# Patient Record
Sex: Female | Born: 1960 | Race: White | Hispanic: No | Marital: Married | Smoking: Former smoker
Health system: Southern US, Community
[De-identification: ages and names within clinical notes are randomized; demographics above are authoritative.]

## PROBLEM LIST (undated history)

## (undated) DIAGNOSIS — Z8041 Family history of malignant neoplasm of ovary: Secondary | ICD-10-CM

## (undated) DIAGNOSIS — I10 Essential (primary) hypertension: Secondary | ICD-10-CM

## (undated) DIAGNOSIS — Z8719 Personal history of other diseases of the digestive system: Secondary | ICD-10-CM

## (undated) DIAGNOSIS — K219 Gastro-esophageal reflux disease without esophagitis: Secondary | ICD-10-CM

## (undated) DIAGNOSIS — K635 Polyp of colon: Secondary | ICD-10-CM

## (undated) DIAGNOSIS — T7840XA Allergy, unspecified, initial encounter: Secondary | ICD-10-CM

## (undated) DIAGNOSIS — C801 Malignant (primary) neoplasm, unspecified: Secondary | ICD-10-CM

## (undated) DIAGNOSIS — Z803 Family history of malignant neoplasm of breast: Secondary | ICD-10-CM

## (undated) DIAGNOSIS — E785 Hyperlipidemia, unspecified: Secondary | ICD-10-CM

## (undated) DIAGNOSIS — K317 Polyp of stomach and duodenum: Secondary | ICD-10-CM

## (undated) DIAGNOSIS — J45909 Unspecified asthma, uncomplicated: Secondary | ICD-10-CM

## (undated) DIAGNOSIS — E119 Type 2 diabetes mellitus without complications: Secondary | ICD-10-CM

## (undated) DIAGNOSIS — Z800 Family history of malignant neoplasm of digestive organs: Secondary | ICD-10-CM

## (undated) DIAGNOSIS — D126 Benign neoplasm of colon, unspecified: Secondary | ICD-10-CM

## (undated) HISTORY — DX: Malignant (primary) neoplasm, unspecified: C80.1

## (undated) HISTORY — DX: Type 2 diabetes mellitus without complications: E11.9

## (undated) HISTORY — DX: Family history of malignant neoplasm of breast: Z80.3

## (undated) HISTORY — PX: ABDOMINAL HYSTERECTOMY: SHX81

## (undated) HISTORY — DX: Benign neoplasm of colon, unspecified: D12.6

## (undated) HISTORY — PX: TUBAL LIGATION: SHX77

## (undated) HISTORY — PX: HERNIA REPAIR: SHX51

## (undated) HISTORY — DX: Allergy, unspecified, initial encounter: T78.40XA

## (undated) HISTORY — DX: Unspecified asthma, uncomplicated: J45.909

## (undated) HISTORY — DX: Polyp of stomach and duodenum: K31.7

## (undated) HISTORY — PX: LITHOTRIPSY: SUR834

## (undated) HISTORY — DX: Family history of malignant neoplasm of ovary: Z80.41

## (undated) HISTORY — PX: MELANOMA EXCISION: SHX5266

## (undated) HISTORY — PX: LAPAROSCOPIC GASTRIC SLEEVE RESECTION: SHX5895

## (undated) HISTORY — PX: TONSILLECTOMY: SUR1361

## (undated) HISTORY — DX: Gastro-esophageal reflux disease without esophagitis: K21.9

## (undated) HISTORY — PX: COLONOSCOPY: SHX174

## (undated) HISTORY — PX: COSMETIC SURGERY: SHX468

## (undated) HISTORY — DX: Polyp of colon: K63.5

## (undated) HISTORY — DX: Family history of malignant neoplasm of digestive organs: Z80.0

## (undated) HISTORY — DX: Hyperlipidemia, unspecified: E78.5

---

## 1998-01-29 ENCOUNTER — Ambulatory Visit (HOSPITAL_COMMUNITY): Admission: RE | Admit: 1998-01-29 | Discharge: 1998-01-29 | Payer: Self-pay | Admitting: Family Medicine

## 1998-01-29 ENCOUNTER — Encounter: Payer: Self-pay | Admitting: Family Medicine

## 1998-10-12 ENCOUNTER — Emergency Department (HOSPITAL_COMMUNITY): Admission: EM | Admit: 1998-10-12 | Discharge: 1998-10-12 | Payer: Self-pay | Admitting: Internal Medicine

## 2000-03-23 ENCOUNTER — Ambulatory Visit (HOSPITAL_COMMUNITY): Admission: RE | Admit: 2000-03-23 | Discharge: 2000-03-23 | Payer: Self-pay | Admitting: Family Medicine

## 2000-03-23 ENCOUNTER — Encounter: Payer: Self-pay | Admitting: Family Medicine

## 2001-05-10 ENCOUNTER — Other Ambulatory Visit: Admission: RE | Admit: 2001-05-10 | Discharge: 2001-05-10 | Payer: Self-pay | Admitting: Obstetrics and Gynecology

## 2001-05-21 ENCOUNTER — Ambulatory Visit (HOSPITAL_BASED_OUTPATIENT_CLINIC_OR_DEPARTMENT_OTHER): Admission: RE | Admit: 2001-05-21 | Discharge: 2001-05-21 | Payer: Self-pay | Admitting: General Surgery

## 2002-04-18 ENCOUNTER — Encounter: Payer: Self-pay | Admitting: Family Medicine

## 2002-04-18 ENCOUNTER — Ambulatory Visit (HOSPITAL_COMMUNITY): Admission: RE | Admit: 2002-04-18 | Discharge: 2002-04-18 | Payer: Self-pay | Admitting: Family Medicine

## 2003-04-28 ENCOUNTER — Emergency Department (HOSPITAL_COMMUNITY): Admission: EM | Admit: 2003-04-28 | Discharge: 2003-04-28 | Payer: Self-pay | Admitting: *Deleted

## 2003-06-04 ENCOUNTER — Other Ambulatory Visit: Admission: RE | Admit: 2003-06-04 | Discharge: 2003-06-04 | Payer: Self-pay | Admitting: Obstetrics and Gynecology

## 2003-07-02 ENCOUNTER — Observation Stay (HOSPITAL_COMMUNITY): Admission: RE | Admit: 2003-07-02 | Discharge: 2003-07-03 | Payer: Self-pay | Admitting: Obstetrics and Gynecology

## 2003-07-02 ENCOUNTER — Encounter (INDEPENDENT_AMBULATORY_CARE_PROVIDER_SITE_OTHER): Payer: Self-pay | Admitting: *Deleted

## 2004-08-16 ENCOUNTER — Other Ambulatory Visit: Admission: RE | Admit: 2004-08-16 | Discharge: 2004-08-16 | Payer: Self-pay | Admitting: Obstetrics and Gynecology

## 2007-12-28 ENCOUNTER — Emergency Department (HOSPITAL_COMMUNITY): Admission: EM | Admit: 2007-12-28 | Discharge: 2007-12-28 | Payer: Self-pay | Admitting: Emergency Medicine

## 2009-07-26 ENCOUNTER — Ambulatory Visit: Payer: Self-pay | Admitting: Radiology

## 2009-07-26 ENCOUNTER — Emergency Department (HOSPITAL_BASED_OUTPATIENT_CLINIC_OR_DEPARTMENT_OTHER): Admission: EM | Admit: 2009-07-26 | Discharge: 2009-07-26 | Payer: Self-pay | Admitting: Emergency Medicine

## 2010-07-02 NOTE — H&P (Signed)
NAME:  Megan Petty, RINN NO.:  [id number]   MEDICAL RECORD NO.:  [id number]                   PATIENT TYPE:  OBV   LOCATION:  9399                                 [facility]ILITY:  [facility]   PHYSICIAN:  Juluis Mire, M.D.                DATE OF BIRTH:  1961-02-07   DATE OF ADMISSION:  07/02/2003  DATE OF DISCHARGE:                                HISTORY & PHYSICAL   CHIEF COMPLAINT:  The patient is a 50 year old gravida 2 para 2 married  white female who presents for laparoscopic-assisted vaginal hysterectomy.   PRESENT ADMISSION:  The patient has a longstanding history of abnormal  uterine bleeding and anovulatory cycling.  We have been cycling her with  Prometrium.  Despite this she has extremely heavy flows that is becoming  limiting from the patient's standpoint.  She had undergone a previous saline  infusion ultrasound and endometrial sampling in May 2003 with the finding of  uterine enlargement possibly consistent with adenomyosis.  Biopsy of the  endometrium revealed proliferative endometrium.  There was no evidence of  hyperplasia.  She does smoke; therefore, birth control pills are relatively  contraindicated.  Despite the Prometrium, again cycles have been heavy and  disruptive.  Now she presents for laparoscopic-assisted vaginal hysterectomy  for management.  She is markedly obese and this has raised some concern  about surgical management.   ALLERGIES:  No known drug allergies.   MEDICATIONS:  Prometrium.   PAST MEDICAL HISTORY:  Usual childhood diseases, no significant sequelae.   PAST SURGICAL HISTORY:  Two prior cesarean sections with bilateral tubal  ligation done with the last one.   FAMILY HISTORY:  Noncontributory.   SOCIAL HISTORY:  Does reveal a history of tobacco use, occasional alcohol  use.   REVIEW OF SYSTEMS:  Noncontributory.   PHYSICAL EXAMINATION:  VITAL SIGNS:  The patient is afebrile with stable  vital signs.  HEENT:   The patient is normocephalic.  Pupils equal, round, and reactive to  light and accommodation.  Extraocular movements were intact.  Sclerae and  conjunctivae were clear, oropharynx clear.  NECK:  Without thyromegaly.  BREASTS:  No discrete masses.  LUNGS:  Clear.  CARDIOVASCULAR:  Regular rate and rhythm, no murmurs or gallops.  ABDOMEN:  Benign.  No mass, organomegaly, or tenderness.  Well-healed low  transverse incision.  Exam somewhat limited by obesity.  PELVIC:  Normal external genitalia, vaginal mucosa is clear.  Cervix  unremarkable.  Uterus upper limits of normal size.  Adnexa unremarkable.  EXTREMITIES:  Trace edema.  NEUROLOGIC:  Grossly within normal limits.   IMPRESSION:  Abnormal uterine bleeding, possible uterine adenomyosis.   PLAN:  The patient to undergo laparoscopic-assisted vaginal hysterectomy.  The risks of surgery have been discussed.  Specific risks in relation to  obesity have been explained, the possible need for abdominal procedure  discussed.  Other risks include the risks  of anesthetics; the risk of  infection; the risk of hemorrhage that could require transfusion with the  risk of AIDS or hepatitis; the risk of injury to adjacent organs including  bladder, bowel, or ureters that could require further exploratory surgery;  the risk of deep venous thrombosis and pulmonary embolus.                                               Juluis Mire, M.D.    JSM/MEDQ  D:  07/02/2003  T:  07/02/2003  Job:  [id number]

## 2010-07-02 NOTE — Discharge Summary (Signed)
NAME:  Megan Petty, BROTHERTON                         ACCOUNT NO.:  [id number]   MEDICAL RECORD NO.:  [id number]                   PATIENT TYPE:  OBV   LOCATION:  9311                                 [facility]   PHYSICIAN:  Juluis Mire, M.D.                DATE OF BIRTH:  Aug 11, 1960   DATE OF ADMISSION:  07/02/2003  DATE OF DISCHARGE:                                 DISCHARGE SUMMARY   ADMITTING DIAGNOSIS:  Uterine adenomyosis.   DISCHARGE DIAGNOSIS:  Uterine adenomyosis, pathology pending.   OPERATIVE PROCEDURE:  Laparoscopic-assisted vaginal hysterectomy.   For complete history and physical please see dictated note.   COURSE IN THE HOSPITAL:  The patient underwent laparoscopic-assisted vaginal  hysterectomy.  The ovaries were left in place.  Besides pelvic adhesions  there was no other evidence of pelvic pathology.  Postoperatively did well.  Postoperative hemoglobin 9.7.  Discharged home on postoperative day #1.  At  that time was tolerating a diet.  She was ambulating without difficulty.  She was voiding without difficulty.  Abdominal exam revealed incisions  intact, bowel sounds were active.  She had no active vaginal bleeding.   COMPLICATIONS:  None encountered during her stay in the hospital.  The  patient was discharged home in stable condition.   DISPOSITION:  The patient is to avoid heavy lifting, vaginal entrance, or  driving of a car.  She will watch for signs of infection, nausea, vomiting,  increasing pain, or active vaginal bleeding.  She was discharged home on  Tylox as needed for pain.  Reevaluation in the office in 1 week.                                               Juluis Mire, M.D.    JSM/MEDQ  D:  07/03/2003  T:  07/03/2003  Job:  [id number]

## 2010-07-02 NOTE — Op Note (Signed)
NAME:  Megan Petty, Megan Petty                         ACCOUNT NO.:  [id number]   MEDICAL RECORD NO.:  [id number]                   PATIENT TYPE:  OBV   LOCATION:  9311                                 [facility]   PHYSICIAN:  Juluis Mire, M.D.                DATE OF BIRTH:  1960-09-13   DATE OF PROCEDURE:  07/02/2003  DATE OF DISCHARGE:                                 OPERATIVE REPORT   PREOPERATIVE DIAGNOSES:  Adenomyosis resulting abnormal bleeding.   POSTOPERATIVE DIAGNOSES:  Adenomyosis resulting abnormal bleeding.   OPERATION:  Laparoscopically assisted vaginal hysterectomy.   SURGEON:  Juluis Mire, M.D.   ASSISTANT:  Dineen Kid. Rana Snare, M.D.   ANESTHESIA:  General endotracheal.   ESTIMATED BLOOD LOSS:  400 mL.   PACKS AND DRAINS:  None.   INTRAOPERATIVE BLOOD REPLACED:  None.   COMPLICATIONS:  None.   INDICATIONS FOR PROCEDURE:  Dictated in history and physical.   DESCRIPTION OF PROCEDURE:  The patient was taken to the OR and placed in  supine position. After a satisfactory level of general endotracheal  anesthesia was obtained, the patient was placed in the dorsal supine  position using the Allen stirrups. The abdomen, perineum and vagina were  prepped out with Betadine.  The bladder was __________ catheterization. A  Hulka tenaculum was put in place and secured, the patient draped in a  sterile field, a subumbilical incision made with a knife and extended to the  subcutaneous tissue. The fascia was entered sharply and incision __________  extended laterally.  The peritoneum was entered. There were omental  adhesions noted. We inserted the open laparoscopic trocar and inflated the  abdomen. The laparoscope was introduced, we were able to move to the right  side of the omental adhesions, no bowel was encountered. The omental  adhesions did extend to the left side of the lower pelvic area. A 5 mm  trocar was placed in the suprapubic area under direct visualization.  The  uterus was elevated. There were some dense adhesions between the bladder  flap and the left lower quadrant of the pelvic area. The tubes and ovaries  were otherwise unremarkable.  Using the tripolar, the right uteroovarian  pedicles was cauterized and incised, the right tube and mesosalpinx was  cauterized and incised. The right round ligament was cauterized and incised.  We did extend this into the broad ligament. Next we went to the left side,  the left uteroovarian pedicle was cauterized and incised, the left tube and  mesosalpinx were cauterized and incised and the left round ligament was  cauterized. We then took down the bladder flap using the scissors and the  tripolar. After this we had good hemostasis and freeing up of the uterus.  The abdomen was deflated with carbon dioxide, laparoscope was removed.   The Hulka tenaculum was then removed, a weighted speculum was placed in the  vaginal vault.  The cervix was grasped with a Jacob's tenaculum. The cul-de-  sac was entered sharply. Both uterosacral ligaments were clamped, cut and  suture ligated with #0 Vicryl. The reflection of vaginal mucosa anteriorly  was incised and the bladder was dissected superiorly.  Paracervical tissue  was then clamped, cut and suture ligated with #0 Vicryl.  The perimetrium  was then serially separated from the sides of the uterus using clamp, cut  and tie technique with suture ligatures of #0 Vicryl.  The vesicouterine  space had been entered, retractors put in place and bladder was retracted  superiorly. At this point in time, the bladder was flipped, remaining  pedicles were clamped and cut and uterus passed off the operative field.  Pedicles secured with free tie of #0 Vicryl. Areas of bleeding on the  vaginal cuff were brought under control with figure-of-eights of #0 Vicryl.  The vaginal mucosa was then reapproximated in the midline with figure-of-  eights of #0 Vicryl.  A sponge on a sponge  stick was then placed in the  vaginal vault and a Foley was placed to straight drain retrieval of an  adequate amount of clear urine.   The abdomen was reinsufflated with carbon dioxide.  Due to the bowel  descending in the pelvis, a third 5 mm trocar was put in place in the right  lower quadrant. The bowel was elevated and some areas of oozing were noted  at the vaginal cuff and brought under control with the tripolar. Both  ovarian vasculatures were hemostatically intact. At this point in time, we  thoroughly irrigated the pelvis and had no further active bleeding. The  abdomen was deflated with carbon dioxide, all trocars removed, the  subumbilical fascia was closed with two figure-of-eights of #0 Vicryl, skin  with interrupted subcuticular's of 4-0 Vicryl. The suprapubic incision was  closed with Dermabond. The sponge on a sponge stick was removed from the  vaginal vault. The patient was taken out of the dorsal supine position, once  alert and extubated transferred to the recovery room in good condition.  Sponge, instrument and needle counts reported as correct by the circulating  nurse x2.  Foley catheter remained clear at the time of closure.                                               Juluis Mire, M.D.    JSM/MEDQ  D:  07/02/2003  T:  07/03/2003  Job:  [id number]

## 2010-11-16 LAB — POCT I-STAT, CHEM 8
BUN: 17
Creatinine, Ser: 0.9
Glucose, Bld: 100 — ABNORMAL HIGH
Hemoglobin: 16 — ABNORMAL HIGH

## 2010-11-16 LAB — URINALYSIS, ROUTINE W REFLEX MICROSCOPIC
Bilirubin Urine: NEGATIVE
Glucose, UA: NEGATIVE
Ketones, ur: NEGATIVE
Nitrite: NEGATIVE
Specific Gravity, Urine: 1.019
pH: 7

## 2010-11-16 LAB — POCT CARDIAC MARKERS: CKMB, poc: 1.4

## 2010-11-16 LAB — URINE MICROSCOPIC-ADD ON

## 2011-01-19 DIAGNOSIS — J45909 Unspecified asthma, uncomplicated: Secondary | ICD-10-CM | POA: Insufficient documentation

## 2011-02-17 DIAGNOSIS — Z903 Acquired absence of stomach [part of]: Secondary | ICD-10-CM | POA: Insufficient documentation

## 2011-05-01 ENCOUNTER — Encounter (HOSPITAL_COMMUNITY): Payer: Self-pay | Admitting: Emergency Medicine

## 2011-05-01 ENCOUNTER — Emergency Department (HOSPITAL_COMMUNITY)
Admission: EM | Admit: 2011-05-01 | Discharge: 2011-05-01 | Disposition: A | Discharge status: Home Health | Payer: Managed Care, Other (non HMO) | Attending: Emergency Medicine | Admitting: Emergency Medicine

## 2011-05-01 DIAGNOSIS — N949 Unspecified condition associated with female genital organs and menstrual cycle: Secondary | ICD-10-CM | POA: Insufficient documentation

## 2011-05-01 DIAGNOSIS — N760 Acute vaginitis: Secondary | ICD-10-CM | POA: Insufficient documentation

## 2011-05-01 DIAGNOSIS — I10 Essential (primary) hypertension: Secondary | ICD-10-CM | POA: Insufficient documentation

## 2011-05-01 DIAGNOSIS — R102 Pelvic and perineal pain: Secondary | ICD-10-CM

## 2011-05-01 DIAGNOSIS — A499 Bacterial infection, unspecified: Secondary | ICD-10-CM | POA: Insufficient documentation

## 2011-05-01 DIAGNOSIS — B9689 Other specified bacterial agents as the cause of diseases classified elsewhere: Secondary | ICD-10-CM | POA: Insufficient documentation

## 2011-05-01 DIAGNOSIS — R3916 Straining to void: Secondary | ICD-10-CM | POA: Insufficient documentation

## 2011-05-01 HISTORY — DX: Essential (primary) hypertension: I10

## 2011-05-01 LAB — URINE MICROSCOPIC-ADD ON

## 2011-05-01 LAB — WET PREP, GENITAL

## 2011-05-01 LAB — URINALYSIS, ROUTINE W REFLEX MICROSCOPIC
Glucose, UA: NEGATIVE mg/dL
Specific Gravity, Urine: 1.024 (ref 1.005–1.030)
Urobilinogen, UA: 1 mg/dL (ref 0.0–1.0)

## 2011-05-01 MED ORDER — METRONIDAZOLE 500 MG PO TABS: 500.0000 mg | ORAL_TABLET | Freq: Two times a day (BID) | ORAL | Status: AC

## 2011-05-01 MED ORDER — OXYCODONE-ACETAMINOPHEN 5-325 MG PO TABS: 1.0000 | ORAL_TABLET | Freq: Four times a day (QID) | ORAL | Status: AC | PRN

## 2011-05-01 NOTE — ED Provider Notes (Signed)
History     CSN: [id number]  Arrival date & time 05/01/11  0505   First MD Initiated Contact with Patient 05/01/11 0601      Chief Complaint  Patient presents with  . Pelvic Pain    and pressure    (Consider location/radiation/quality/duration/timing/severity/associated sxs/prior treatment) HPI Comments: Patient presents with several week history of pelvic pain. Pain is intermittent and is described as a pressure and like she "has to go to the bathroom". Patient also states some difficulty initiating urine stream at times. Pain is worse in the evening. It is made worse with lying flat. It is improved with "sitting on the toilet". Patient states she takes hydrocodone that she had left over from a recent surgery which helped the pain up until last night. Last night pain was worse and was persistent. The patient cannot sleep. This is why she came to the emergency department. Patient has an upcoming appointment with a urologist next month. She was seen previously by her primary care physician and had normal urine tests. Patient had gastric sleeve and hernia surgery performed several months ago at [facility].  The history is provided by the patient.    Past Medical History  Diagnosis Date  . Hypertension     Past Surgical History  Procedure Date  . Abdominal surgery   . Hernia repair   . Partial hysterectomy     History reviewed. No pertinent family history.  History  Substance Use Topics  . Smoking status: Never Smoker   . Smokeless tobacco: Not on file  . Alcohol Use: No    OB History    Grav Para Term Preterm Abortions TAB SAB Ect Mult Living                  Review of Systems  Constitutional: Negative for fever.  HENT: Negative for sore throat and rhinorrhea.   Eyes: Negative for redness.  Respiratory: Negative for cough.   Cardiovascular: Negative for chest pain.  Gastrointestinal: Negative for nausea, vomiting, abdominal pain, diarrhea and blood in  stool.  Genitourinary: Positive for urgency, difficulty urinating, vaginal pain and pelvic pain. Negative for dysuria, frequency, hematuria, vaginal bleeding and vaginal discharge.  Musculoskeletal: Negative for myalgias.  Skin: Negative for rash.  Neurological: Negative for headaches.    Allergies  Review of patient's allergies indicates no known allergies.  Home Medications   Current Outpatient Rx  Name Route Sig Dispense Refill  . HYDROCODONE-ACETAMINOPHEN 7.5-500 MG/15ML PO SOLN Oral Take 10 mLs by mouth every 4 (four) hours as needed. For pain    . LISINOPRIL 20 MG PO TABS Oral Take 20 mg by mouth daily.    Marland Kitchen OMEPRAZOLE 40 MG PO CPDR Oral Take 40 mg by mouth daily.    Marland Kitchen URSODIOL 300 MG PO CAPS Oral Take 300 mg by mouth 2 (two) times daily.      BP 120/78  Pulse 91  Temp(Src) 97.6 F (36.4 C) (Oral)  Resp 20  SpO2 100%  Physical Exam  Nursing note and vitals reviewed. Constitutional: She is oriented to person, place, and time. She appears well-developed and well-nourished.  HENT:  Head: Normocephalic and atraumatic.  Eyes: Conjunctivae are normal. Right eye exhibits no discharge. Left eye exhibits no discharge.  Neck: Normal range of motion. Neck supple.  Cardiovascular: Normal rate, regular rhythm and normal heart sounds.   No murmur heard. Pulmonary/Chest: Effort normal and breath sounds normal. No respiratory distress.  Abdominal: Soft. There is no tenderness.  Genitourinary: There is no rash on the right labia. There is no rash on the left labia. Right adnexum displays no tenderness. Left adnexum displays no tenderness. No erythema or bleeding around the vagina. No foreign body around the vagina. Vaginal discharge found.       Cervix not present. Thick white vaginal discharge noted in vaginal vault. No obvious vaginal prolapse. No L/R adnexal tenderness. Pressure reproduced with palpation over suprapubic area on bimanual exam.  Neurological: She is alert and oriented  to person, place, and time.  Skin: Skin is warm and dry.  Psychiatric: She has a normal mood and affect.    ED Course  Procedures (including critical care time)  Labs Reviewed  WET PREP, GENITAL - Abnormal; Notable for the following:    Clue Cells Wet Prep HPF POC TOO NUMEROUS TO COUNT (*)    WBC, Wet Prep HPF POC MODERATE (*)    All other components within normal limits  URINALYSIS, ROUTINE W REFLEX MICROSCOPIC - Abnormal; Notable for the following:    APPearance CLOUDY (*)    Hgb urine dipstick MODERATE (*)    Bilirubin Urine SMALL (*)    Ketones, ur TRACE (*)    Leukocytes, UA MODERATE (*)    All other components within normal limits  URINE MICROSCOPIC-ADD ON - Abnormal; Notable for the following:    Squamous Epithelial / LPF MANY (*)    Bacteria, UA FEW (*)    Crystals CA OXALATE CRYSTALS (*)    All other components within normal limits  GC/CHLAMYDIA PROBE AMP, GENITAL  URINE CULTURE   No results found.   1. Pelvic pain   2. Bacterial vaginosis     6:46 AM Patient seen and examined. Work-up initiated.  Vital signs reviewed and are as follows: Filed Vitals:   05/01/11 0506  BP: 120/78  Pulse: 91  Temp: 97.6 F (36.4 C)  Resp: 20   6:46 AM Patient was discussed with Gwyneth Sprout, MD  7:29 AM Pelvic exam performed.   8:05 AM Patient informed of all results, including wet prep. Will treat for BV. Pt is requesting discharge because of an event at her church. Her pain is improved. Urged patient to take antibiotics as prescribed and followup with her PCP and urologist as previously planned.  8:06 AM The patient was urged to return to the Emergency Department immediately with worsening of current symptoms, worsening abdominal pain, persistent vomiting, blood noted in stools, fever, or any other concerns. The patient verbalized understanding.   8:06 AM Patient counseled on use of narcotic pain medications. Counseled not to combine these medications with others  containing tylenol. Urged not to drink alcohol, drive, or perform any other activities that requires focus while taking these medications. The patient verbalizes understanding and agrees with the plan.  MDM  Patient with pelvic pain. Do not suspect abdominal infection given duration of symptoms and presentation. BV noted and will treat. UA is not clean catch, culture sent. UA also shows calcium oxalate crystals however symptoms are atypical for kidney stone. Doubt kidney stone at this time. Patient has appropriate urology followup. No definite vaginal prolapse. Uncertain bladder prolapse. Patient noted to be hypotensive at discharge, however she appears well, not dizzy. This is likely 2/2 resting and pain medication.    Renne Crigler, [place] 05/01/11 [phone]

## 2011-05-01 NOTE — ED Notes (Signed)
Pt presented to the ER with c/o pelvic pain and pressure. States that sx started about 5 weeks ago and pain is on and off, however today pain started around 2100 and not able to be controled. Pt also states that she has Rx pain medication, no relief. Pt reports having surgery in the [facility] abd surgery "beariatric sleve and x2 hernias are repaired, inguinal and umbilical.

## 2011-05-01 NOTE — Discharge Instructions (Signed)
Please read and follow all provided instructions.  Your diagnoses today include:  1. Pelvic pain   2. Bacterial vaginosis     Tests performed today include:  Urine test - showed uncertain infection, culture sent. If this is positive, you will be called and given a prescription for an antibiotic  Wet prep - shows bacterial vaginosis  Vital signs. See below for your results today.   Medications prescribed:   Percocet (oxycodone/acetaminophen) - narcotic pain medication  You have been prescribed narcotic pain medication such as Vicodin or Percocet: DO NOT drive or perform any activities that require you to be awake and alert because this medicine can make you drowsy. BE VERY CAREFUL not to take multiple medicines containing Tylenol (also called acetaminophen). Doing so can lead to an overdose which can damage your liver and cause liver failure and possibly death.   Take any prescribed medications only as directed.  Home care instructions:  Follow any educational materials contained in this packet.  Follow-up instructions: Please follow-up with your primary care provider in the next 3 days for further evaluation of your symptoms. If you do not have a primary care doctor -- see below for referral information.   Return instructions:   Please return to the Emergency Department if you experience worsening symptoms.   See your doctor and urologist as previously planned.   Please return if you have any other emergent concerns.  Additional Information:  Your vital signs today were: BP 86/43  Pulse 65  Temp(Src) 97.5 F (36.4 C) (Oral)  Resp 18  SpO2 98% If your blood pressure (BP) was elevated above 135/85 this visit, please have this repeated by your doctor within one month. -------------- No Primary Care Doctor Call [facility]  [phone] Other agencies that provide inexpensive medical care    [facility]  [phone]    [facility]   [phone]    [facility]  [phone]    [facility]  [phone]    [facility]  [phone]    [facility]  [phone] -------------- RESOURCE GUIDE:  Dental Problems  Patients with Medicaid: [facility] [phone]                                            [phone] [address] Phone:  [phone]                                                   Phone:  [phone]  If unable to pay or uninsured, contact:  [facility] or [facility] to become qualified for the adult dental clinic.  Chronic Pain Problems Contact [facility]  [phone] Patients need to be referred by their primary care doctor.  Insufficient Money for Medicine Contact [employer] Way:  call "211" or [facility] [phone].  Psychological Services [facility] Behavioral Health  [phone] [facility]  [phone] [facility] Mental Health   [phone] (emergency services [phone])  Substance Abuse Resources [facility]  [phone] [facility] [phone]  [facility] [phone] [facility] [phone] Residential & Outpatient Substance Abuse Program  [phone]  Abuse/Neglect [facility] Child Abuse Hotline [phone] [facility] Child Abuse Hotline [phone]) [phone] (After Hours)  Emergency Shelter [facility] [phone]  [facility] [place] [facility] 5208815645 [place] [facility] [phone]  [facility]  [facility] [place]     [employer]                          [facility] Dept. [address]                        [address]      [address]  Blondell Reveal Phone:  [phone]                                   Phone:  [phone]                  Phone:  [phone]  [facility] Phone:  [phone]  [facility] Child Abuse Hotline [phone] [phone] (After Hours)

## 2011-05-02 LAB — URINE CULTURE

## 2011-05-04 NOTE — ED Provider Notes (Signed)
Medical screening examination/treatment/procedure(s) were performed by non-physician practitioner and as supervising physician I was immediately available for consultation/collaboration.   Gwyneth Sprout, MD 05/04/11 1526

## 2012-02-23 LAB — HM PAP SMEAR: HM PAP: NEGATIVE

## 2012-02-24 ENCOUNTER — Encounter: Payer: Self-pay | Admitting: Internal Medicine

## 2012-03-22 ENCOUNTER — Ambulatory Visit (AMBULATORY_SURGERY_CENTER): Payer: Managed Care, Other (non HMO) | Admitting: *Deleted

## 2012-03-22 ENCOUNTER — Encounter: Payer: Self-pay | Admitting: Internal Medicine

## 2012-03-22 VITALS — Ht 68.0 in | Wt 229.2 lb

## 2012-03-22 DIAGNOSIS — Z1211 Encounter for screening for malignant neoplasm of colon: Secondary | ICD-10-CM

## 2012-03-22 MED ORDER — NA SULFATE-K SULFATE-MG SULF 17.5-3.13-1.6 GM/177ML PO SOLN: ORAL | Status: DC

## 2012-03-22 NOTE — Progress Notes (Signed)
Pt has no allergy to eggs or soy products  She has a gastric sleeve and states she is limited in her po intake.  States she isn't able to do the moviprep and cannot do the Suprep split dose as normally instructed.  Spoke with Dr. Rhea Belton and he told me to call the Suprep rep, Jonny Ruiz, for instruction.  Per Jonny Ruiz, pt is to drink 1st of Suprep the a.m. Day before her procedure.  The, drink second dose 10 hours later.  She is to push fluids as much as possible.  She is instructed this way.

## 2012-03-28 ENCOUNTER — Encounter: Payer: Managed Care, Other (non HMO) | Admitting: Internal Medicine

## 2012-04-04 ENCOUNTER — Ambulatory Visit (AMBULATORY_SURGERY_CENTER): Payer: Managed Care, Other (non HMO) | Admitting: Internal Medicine

## 2012-04-04 ENCOUNTER — Encounter: Payer: Self-pay | Admitting: Internal Medicine

## 2012-04-04 VITALS — BP 94/60 | HR 51 | Temp 96.8°F | Resp 23 | Ht 68.0 in | Wt 229.0 lb

## 2012-04-04 DIAGNOSIS — D126 Benign neoplasm of colon, unspecified: Secondary | ICD-10-CM

## 2012-04-04 DIAGNOSIS — Z800 Family history of malignant neoplasm of digestive organs: Secondary | ICD-10-CM

## 2012-04-04 DIAGNOSIS — Z1211 Encounter for screening for malignant neoplasm of colon: Secondary | ICD-10-CM

## 2012-04-04 MED ORDER — SODIUM CHLORIDE 0.9 % IV SOLN: 500.0000 mL | INTRAVENOUS | Status: DC

## 2012-04-04 NOTE — Op Note (Signed)
[facility] [address] [place], [address]   COLONOSCOPY PROCEDURE REPORT  PATIENT: Megan, Petty  MR#: [id number] BIRTHDATE: 1960/04/28 , 51  yrs. old GENDER: Female ENDOSCOPIST: Beverley Fiedler, MD REFERRED BY: Bradd Burner, MD PROCEDURE DATE:  04/04/2012 PROCEDURE:   Colonoscopy with snare polypectomy ASA CLASS:   Class II INDICATIONS:elevated risk screening, Patient's immediate family history of colon cancer (mother age 22), and first colonoscopy. MEDICATIONS: MAC sedation, administered by CRNA and propofol (Diprivan) 300mg  IV  DESCRIPTION OF PROCEDURE:   After the risks benefits and alternatives of the procedure were thoroughly explained, informed consent was obtained.  A digital rectal exam revealed external hemorrhoids.   The LB [id number]  endoscope was introduced through the anus and advanced to the cecum, which was identified by both the appendix and ileocecal valve. No adverse events experienced.   The quality of the prep was Suprep fair  The instrument was then slowly withdrawn as the colon was fully examined.   COLON FINDINGS: Three sessile polyps measuring 3 - 6 mm were found in the ascending colon and transverse colon.   Polyps removed with cold snare and successfully retrieved.  There was mild diverticulosis noted in the descending colon and sigmoid colon with associated muscular hypertrophy. Retroflexed views revealed internal/external hemorrhoids. The time to cecum=2 minutes 26 seconds.  Withdrawal time=15 minutes 42 seconds.  The scope was withdrawn and the procedure completed. COMPLICATIONS: There were no complications.  ENDOSCOPIC IMPRESSION: 1.   Three sessile polyps were found in the ascending colon and transverse colon; removed and sent to pathology 2.   There was mild diverticulosis noted in the descending colon and sigmoid colon 3.   Moderate sized internal and external hemorrhoids  RECOMMENDATIONS: 1.   Await pathology results 2.  Hold aspirin, aspirin products, and anti-inflammatory medication for 1 week. 3.  High fiber diet 4.  Timing of repeat colonoscopy will be determined by pathology findings (3 years if all adenomas, otherwise 5 years). 5.  You will receive a letter within 1-2 weeks with the results of your biopsy as well as final recommendations.  Please call my office if you have not received a letter after 3 weeks.   eSigned:  Beverley Fiedler, MD 04/04/2012 9:01 AM   cc: The Patient; Dr. Janace Litten

## 2012-04-04 NOTE — Patient Instructions (Signed)
YOU HAD AN ENDOSCOPIC PROCEDURE TODAY AT THE [facility]: Refer to the procedure report that was given to you for any specific questions about what was found during the examination.  If the procedure report does not answer your questions, please call your gastroenterologist to clarify.  If you requested that your care partner not be given the details of your procedure findings, then the procedure report has been included in a sealed envelope for you to review at your convenience later.  YOU SHOULD EXPECT: Some feelings of bloating in the abdomen. Passage of more gas than usual.  Walking can help get rid of the air that was put into your GI tract during the procedure and reduce the bloating. If you had a lower endoscopy (such as a colonoscopy or flexible sigmoidoscopy) you may notice spotting of blood in your stool or on the toilet paper. If you underwent a bowel prep for your procedure, then you may not have a normal bowel movement for a few days.  DIET: Your first meal following the procedure should be a light meal and then it is ok to progress to your normal diet.  A half-sandwich or bowl of soup is an example of a good first meal.  Heavy or fried foods are harder to digest and may make you feel nauseous or bloated.  Likewise meals heavy in dairy and vegetables can cause extra gas to form and this can also increase the bloating.  Drink plenty of fluids but you should avoid alcoholic beverages for 24 hours.  ACTIVITY: Your care partner should take you home directly after the procedure.  You should plan to take it easy, moving slowly for the rest of the day.  You can resume normal activity the day after the procedure however you should NOT DRIVE or use heavy machinery for 24 hours (because of the sedation medicines used during the test).    SYMPTOMS TO REPORT IMMEDIATELY: A gastroenterologist can be reached at any hour.  During normal business hours, 8:30 AM to 5:00 PM Monday through Friday,  call [phone].  After hours and on weekends, please call the GI answering service at [phone] who will take a message and have the physician on call contact you.   Following lower endoscopy (colonoscopy or flexible sigmoidoscopy):  Excessive amounts of blood in the stool  Significant tenderness or worsening of abdominal pains  Swelling of the abdomen that is new, acute  Fever of 100F or higher    FOLLOW UP: If any biopsies were taken you will be contacted by phone or by letter within the next 1-3 weeks.  Call your gastroenterologist if you have not heard about the biopsies in 3 weeks.  Our staff will call the home number listed on your records the next business day following your procedure to check on you and address any questions or concerns that you may have at that time regarding the information given to you following your procedure. This is a courtesy call and so if there is no answer at the home number and we have not heard from you through the emergency physician on call, we will assume that you have returned to your regular daily activities without incident.  SIGNATURES/CONFIDENTIALITY: You and/or your care partner have signed paperwork which will be entered into your electronic medical record.  These signatures attest to the fact that that the information above on your After Visit Summary has been reviewed and is understood.  Full responsibility of the confidentiality   of this discharge information lies with you and/or your care-partner.    Information on polyps,diverticulosis,hemorrhoids,& high fiber diet given to you today   HOLD ASPIRIN AND ANTI-INFLAMMATORY PRODUCTS FOR ONE WEEK

## 2012-04-04 NOTE — Progress Notes (Signed)
Lidocaine-40mg IV prior to Propofol InductionPropofol given over incremental dosages 

## 2012-04-04 NOTE — Progress Notes (Signed)
Patient did not experience any of the following events: a burn prior to discharge; a fall within the facility; wrong site/side/patient/procedure/implant event; or a hospital transfer or hospital admission upon discharge from the facility. (G8907) Patient did not have preoperative order for IV antibiotic SSI prophylaxis. (G8918)  

## 2012-04-04 NOTE — Progress Notes (Signed)
Called to room to assist during endoscopic procedure.  Patient ID and intended procedure confirmed with present staff. Received instructions for my participation in the procedure from the performing physician.  

## 2012-04-05 ENCOUNTER — Telehealth: Payer: Self-pay | Admitting: *Deleted

## 2012-04-05 NOTE — Telephone Encounter (Signed)
  Follow up Call-  Call back number 04/04/2012  Post procedure Call Back phone  # [phone]     Patient questions:  Do you have a fever, pain , or abdominal swelling? no Pain Score  0 *  Have you tolerated food without any problems? yes  Have you been able to return to your normal activities? yes  Do you have any questions about your discharge instructions: Diet   no Medications  no Follow up visit  no  Do you have questions or concerns about your Care? no  Actions: * If pain score is 4 or above: No action needed, pain <4.

## 2012-04-09 ENCOUNTER — Encounter: Payer: Self-pay | Admitting: Internal Medicine

## 2012-04-23 ENCOUNTER — Other Ambulatory Visit: Payer: Self-pay | Admitting: Dermatology

## 2012-09-18 ENCOUNTER — Telehealth (INDEPENDENT_AMBULATORY_CARE_PROVIDER_SITE_OTHER): Payer: Self-pay

## 2012-09-18 NOTE — Telephone Encounter (Signed)
Attempted to contact pt to let her know about her appointment tomorrow with Dr. Daphine Deutscher @ 1130am.  Phone does not have answering machine/VM.

## 2012-09-19 ENCOUNTER — Ambulatory Visit (INDEPENDENT_AMBULATORY_CARE_PROVIDER_SITE_OTHER): Payer: Self-pay | Admitting: Surgery

## 2012-11-13 ENCOUNTER — Encounter (INDEPENDENT_AMBULATORY_CARE_PROVIDER_SITE_OTHER): Payer: Self-pay

## 2012-12-17 ENCOUNTER — Encounter (HOSPITAL_COMMUNITY): Payer: Self-pay | Admitting: Pharmacy Technician

## 2012-12-21 ENCOUNTER — Encounter (HOSPITAL_COMMUNITY)
Admission: RE | Admit: 2012-12-21 | Discharge: 2012-12-21 | Disposition: A | Discharge status: Home / Self Care | Payer: Managed Care, Other (non HMO) | Source: Ambulatory Visit | Attending: Specialist | Admitting: Specialist

## 2012-12-21 ENCOUNTER — Encounter (HOSPITAL_COMMUNITY): Payer: Self-pay

## 2012-12-21 ENCOUNTER — Encounter (HOSPITAL_COMMUNITY)
Admission: RE | Admit: 2012-12-21 | Discharge: 2012-12-21 | Disposition: A | Discharge status: Home / Self Care | Payer: Managed Care, Other (non HMO) | Source: Ambulatory Visit | Attending: Anesthesiology | Admitting: Anesthesiology

## 2012-12-21 DIAGNOSIS — Z01812 Encounter for preprocedural laboratory examination: Secondary | ICD-10-CM | POA: Insufficient documentation

## 2012-12-21 DIAGNOSIS — Z01818 Encounter for other preprocedural examination: Secondary | ICD-10-CM | POA: Insufficient documentation

## 2012-12-21 DIAGNOSIS — Z01810 Encounter for preprocedural cardiovascular examination: Secondary | ICD-10-CM | POA: Insufficient documentation

## 2012-12-21 HISTORY — DX: Personal history of other diseases of the digestive system: Z87.19

## 2012-12-21 LAB — CBC
HCT: 43.8 % (ref 36.0–46.0)
Hemoglobin: 15.4 g/dL — ABNORMAL HIGH (ref 12.0–15.0)
MCH: 30.7 pg (ref 26.0–34.0)
MCHC: 35.2 g/dL (ref 30.0–36.0)
MCV: 87.4 fL (ref 78.0–100.0)
Platelets: 198 10*3/uL (ref 150–400)
RBC: 5.01 MIL/uL (ref 3.87–5.11)
RDW: 12.5 % (ref 11.5–15.5)

## 2012-12-21 LAB — BASIC METABOLIC PANEL
BUN: 24 mg/dL — ABNORMAL HIGH (ref 6–23)
CO2: 31 mEq/L (ref 19–32)
Calcium: 9.9 mg/dL (ref 8.4–10.5)
Chloride: 103 mEq/L (ref 96–112)
Creatinine, Ser: 0.88 mg/dL (ref 0.50–1.10)
GFR calc non Af Amer: 75 mL/min — ABNORMAL LOW (ref >90)
Glucose, Bld: 106 mg/dL — ABNORMAL HIGH (ref 70–99)
Sodium: 145 mEq/L (ref 135–145)

## 2012-12-21 NOTE — Pre-Procedure Instructions (Signed)
Megan Petty  12/21/2012   Your procedure is scheduled on: Nov 17th @ 0730  Report to [facility]  2 * 3 at 0530 AM.  Call this number if you have problems the morning of surgery: [phone]   Remember:   Do not eat food or drink liquids after midnight.   Take these medicines the morning of surgery with A SIP OF WATER: NA  Stop taking Aspirin, Aleve, Ibuprofen, BC's, Goody's, Herbal medications, and Fish Oil. Nov 10th Stop taking Naproxen Nov 10th   Do not wear jewelry, make-up or nail polish.  Do not wear lotions, powders, or perfumes. You may wear deodorant.  Do not shave 48 hours prior to surgery.   Do not bring valuables to the hospital.  [facility] is not responsible                  for any belongings or valuables.               Contacts, dentures or bridgework may not be worn into surgery.  Leave suitcase in the car. After surgery it may be brought to your room.  For patients admitted to the hospital, discharge time is determined by your                treatment team.               Patients discharged the day of surgery will not be allowed to drive  home.    Special Instructions: Shower using CHG 2 nights before surgery and the night before surgery.  If you shower the day of surgery use CHG.  Use special wash - you have one bottle of CHG for all showers.  You should use approximately 1/3 of the bottle for each shower.   Please read over the following fact sheets that you were given: Pain Booklet, Coughing and Deep Breathing and Surgical Site Infection Prevention

## 2012-12-21 NOTE — Progress Notes (Signed)
PCP is Elvera Lennox Denies having a cardiologist. Denies having a recent EKG or CXR Denies having a stress test, echo, or card cath.

## 2012-12-25 NOTE — Progress Notes (Signed)
Called Dr. Charlesetta Garibaldi office and spoke to Robards.  Requested orders be placed in EPIC.

## 2012-12-27 NOTE — Progress Notes (Addendum)
Spoke with a voice mail at Dr Charlesetta Garibaldi office to re-request orders be placed in EPIC.

## 2012-12-28 ENCOUNTER — Other Ambulatory Visit: Payer: Self-pay | Admitting: Specialist

## 2012-12-29 ENCOUNTER — Other Ambulatory Visit: Payer: Self-pay | Admitting: Specialist

## 2012-12-29 NOTE — H&P (Signed)
Megan Petty is an 52 y.o. female.   Chief Complaint: Increased panniculus and periumbilical pain HPI: HX of obesity with resultant intertriginous changes with back pain Hx of by pass GI surgery  Past Medical History  Diagnosis Date  . Hypertension   . Allergy   . Asthma     seasonal  . Cancer     melanoma- left leg  . Diabetes mellitus without complication     hx, not any longer  . H/O hiatal hernia     had surgery to fix with last surgery  . GERD (gastroesophageal reflux disease)     Past Surgical History  Procedure Laterality Date  . Abdominal surgery    . Partial hysterectomy    . Gastric bypass sleeve      repaired 2 hernias at that time  . Melanoma excision    . Cesarean section    . Tonsillectomy    . Hernia repair    . Colonoscopy    . Abdominal hysterectomy      Family History  Problem Relation Age of Onset  . Colon cancer Mother   . Esophageal cancer Neg Hx   . Rectal cancer Neg Hx   . Stomach cancer Neg Hx    Social History:  reports that she quit smoking about 3 years ago. She has never used smokeless tobacco. She reports that she does not drink alcohol or use illicit drugs.  Allergies: No Known Allergies  No prescriptions prior to admission    No results found for this or any previous visit (from the past 48 hour(s)). No results found.  Review of Systems  Constitutional: Negative.   HENT: Negative.   Eyes: Negative.   Respiratory: Negative.   Cardiovascular: Negative.   Gastrointestinal: Positive for heartburn.  Genitourinary: Negative.   Musculoskeletal: Negative.   Skin: Positive for rash.  Neurological: Negative.   Endo/Heme/Allergies: Negative.   Psychiatric/Behavioral: Negative.     There were no vitals taken for this visit. Physical Exam   Assessment/Plan Severe panniculus with panniculitis for panniculectomy and repair of ventral hernia a nd liposuction assistance  Kanetra Ho L 12/29/2012, 12:47 PM

## 2012-12-31 ENCOUNTER — Observation Stay (HOSPITAL_COMMUNITY)
Admission: RE | Admit: 2012-12-31 | Discharge: 2013-01-01 | Disposition: A | Discharge status: Home / Self Care | Payer: Managed Care, Other (non HMO) | Source: Ambulatory Visit | Attending: Specialist | Admitting: Specialist

## 2012-12-31 ENCOUNTER — Encounter (HOSPITAL_COMMUNITY)
Admission: RE | Disposition: A | Discharge status: Home / Self Care | Payer: Self-pay | Source: Ambulatory Visit | Attending: Specialist

## 2012-12-31 ENCOUNTER — Encounter (HOSPITAL_COMMUNITY): Payer: Self-pay | Admitting: Certified Registered Nurse Anesthetist

## 2012-12-31 ENCOUNTER — Inpatient Hospital Stay (HOSPITAL_COMMUNITY): Payer: Managed Care, Other (non HMO) | Admitting: Certified Registered Nurse Anesthetist

## 2012-12-31 ENCOUNTER — Encounter (HOSPITAL_COMMUNITY): Payer: Managed Care, Other (non HMO) | Admitting: Certified Registered Nurse Anesthetist

## 2012-12-31 DIAGNOSIS — Z87891 Personal history of nicotine dependence: Secondary | ICD-10-CM | POA: Insufficient documentation

## 2012-12-31 DIAGNOSIS — Z9889 Other specified postprocedural states: Secondary | ICD-10-CM | POA: Insufficient documentation

## 2012-12-31 DIAGNOSIS — R12 Heartburn: Secondary | ICD-10-CM | POA: Insufficient documentation

## 2012-12-31 DIAGNOSIS — Z9884 Bariatric surgery status: Secondary | ICD-10-CM | POA: Insufficient documentation

## 2012-12-31 DIAGNOSIS — K219 Gastro-esophageal reflux disease without esophagitis: Secondary | ICD-10-CM | POA: Insufficient documentation

## 2012-12-31 DIAGNOSIS — E119 Type 2 diabetes mellitus without complications: Secondary | ICD-10-CM | POA: Insufficient documentation

## 2012-12-31 DIAGNOSIS — M793 Panniculitis, unspecified: Principal | ICD-10-CM | POA: Insufficient documentation

## 2012-12-31 DIAGNOSIS — K439 Ventral hernia without obstruction or gangrene: Secondary | ICD-10-CM | POA: Insufficient documentation

## 2012-12-31 DIAGNOSIS — I10 Essential (primary) hypertension: Secondary | ICD-10-CM | POA: Insufficient documentation

## 2012-12-31 DIAGNOSIS — Z90710 Acquired absence of both cervix and uterus: Secondary | ICD-10-CM | POA: Insufficient documentation

## 2012-12-31 HISTORY — PX: ABDOMINOPLASTY/PANNICULECTOMY WITH LIPOSUCTION: SHX5577

## 2012-12-31 LAB — COMPREHENSIVE METABOLIC PANEL
AST: 15 U/L (ref 0–37)
Albumin: 3.2 g/dL — ABNORMAL LOW (ref 3.5–5.2)
Alkaline Phosphatase: 60 U/L (ref 39–117)
BUN: 18 mg/dL (ref 6–23)
CO2: 24 mEq/L (ref 19–32)
Chloride: 105 mEq/L (ref 96–112)
Creatinine, Ser: 0.88 mg/dL (ref 0.50–1.10)
GFR calc non Af Amer: 75 mL/min — ABNORMAL LOW (ref >90)
Glucose, Bld: 102 mg/dL — ABNORMAL HIGH (ref 70–99)
Potassium: 3.6 mEq/L (ref 3.5–5.1)
Sodium: 139 mEq/L (ref 135–145)
Total Bilirubin: 0.5 mg/dL (ref 0.3–1.2)
Total Protein: 6.4 g/dL (ref 6.0–8.3)

## 2012-12-31 LAB — PROTIME-INR
INR: 1.04 (ref 0.00–1.49)
Prothrombin Time: 13.4 seconds (ref 11.6–15.2)

## 2012-12-31 LAB — CBC
HCT: 39.6 % (ref 36.0–46.0)
Hemoglobin: 13.8 g/dL (ref 12.0–15.0)
MCHC: 34.8 g/dL (ref 30.0–36.0)
Platelets: 132 10*3/uL — ABNORMAL LOW (ref 150–400)
RDW: 12.5 % (ref 11.5–15.5)
WBC: 7.1 10*3/uL (ref 4.0–10.5)

## 2012-12-31 SURGERY — PANNICULECTOMY, ABDOMINAL, WITH LIPOSUCTION
Anesthesia: General | Site: Abdomen | Wound class: Clean

## 2012-12-31 MED ORDER — MORPHINE SULFATE 2 MG/ML IJ SOLN
2.0000 mg | INTRAMUSCULAR | Status: DC | PRN
  Administered 2012-12-31 (×2): 2 mg via INTRAVENOUS
  Filled 2012-12-31 (×2): qty 1

## 2012-12-31 MED ORDER — ONDANSETRON HCL 4 MG/2ML IJ SOLN
4.0000 mg | Freq: Four times a day (QID) | INTRAMUSCULAR | Status: DC | PRN
  Filled 2012-12-31: qty 2

## 2012-12-31 MED ORDER — CEFAZOLIN SODIUM 1-5 GM-% IV SOLN
1.0000 g | Freq: Three times a day (TID) | INTRAVENOUS | Status: DC
  Filled 2012-12-31: qty 50

## 2012-12-31 MED ORDER — GLYCOPYRROLATE 0.2 MG/ML IJ SOLN
INTRAMUSCULAR | Status: DC | PRN
  Administered 2012-12-31: .7 mg via INTRAVENOUS

## 2012-12-31 MED ORDER — CEFAZOLIN SODIUM 1-5 GM-% IV SOLN: 1.0000 g | Freq: Three times a day (TID) | INTRAVENOUS | Status: DC

## 2012-12-31 MED ORDER — HYDROMORPHONE HCL PF 1 MG/ML IJ SOLN
INTRAMUSCULAR | Status: AC
  Administered 2012-12-31: 0.5 mg via INTRAVENOUS
  Filled 2012-12-31: qty 1

## 2012-12-31 MED ORDER — DEXTROSE IN LACTATED RINGERS 5 % IV SOLN
INTRAVENOUS | Status: DC
  Administered 2012-12-31 – 2013-01-01 (×2): via INTRAVENOUS

## 2012-12-31 MED ORDER — LIDOCAINE-EPINEPHRINE 0.5 %-1:200000 IJ SOLN
INTRAMUSCULAR | Status: AC
  Filled 2012-12-31: qty 1

## 2012-12-31 MED ORDER — ONDANSETRON HCL 4 MG PO TABS: 4.0000 mg | ORAL_TABLET | Freq: Four times a day (QID) | ORAL | Status: DC | PRN

## 2012-12-31 MED ORDER — LIDOCAINE-EPINEPHRINE 0.5 %-1:200000 IJ SOLN
INTRAMUSCULAR | Status: DC | PRN
  Administered 2012-12-31 (×2): 50 mL

## 2012-12-31 MED ORDER — NEOSTIGMINE METHYLSULFATE 1 MG/ML IJ SOLN
INTRAMUSCULAR | Status: DC | PRN
  Administered 2012-12-31: 4 mg via INTRAVENOUS

## 2012-12-31 MED ORDER — LIDOCAINE HCL (CARDIAC) 20 MG/ML IV SOLN
INTRAVENOUS | Status: DC | PRN
  Administered 2012-12-31: 80 mg via INTRAVENOUS

## 2012-12-31 MED ORDER — ONDANSETRON HCL 4 MG PO TABS
4.0000 mg | ORAL_TABLET | Freq: Four times a day (QID) | ORAL | Status: DC | PRN
  Filled 2012-12-31: qty 1

## 2012-12-31 MED ORDER — ROCURONIUM BROMIDE 100 MG/10ML IV SOLN
INTRAVENOUS | Status: DC | PRN
  Administered 2012-12-31: 20 mg via INTRAVENOUS
  Administered 2012-12-31: 50 mg via INTRAVENOUS

## 2012-12-31 MED ORDER — 0.9 % SODIUM CHLORIDE (POUR BTL) OPTIME
TOPICAL | Status: DC | PRN
  Administered 2012-12-31 (×2): 1000 mL

## 2012-12-31 MED ORDER — CEFAZOLIN SODIUM-DEXTROSE 2-3 GM-% IV SOLR
2.0000 g | INTRAVENOUS | Status: AC
  Administered 2012-12-31: 2 g via INTRAVENOUS

## 2012-12-31 MED ORDER — PROPOFOL 10 MG/ML IV BOLUS
INTRAVENOUS | Status: DC | PRN
  Administered 2012-12-31: 160 mg via INTRAVENOUS

## 2012-12-31 MED ORDER — HYDROCODONE-ACETAMINOPHEN 5-325 MG PO TABS: 1.0000 | ORAL_TABLET | ORAL | Status: DC | PRN

## 2012-12-31 MED ORDER — ARTIFICIAL TEARS OP OINT
TOPICAL_OINTMENT | OPHTHALMIC | Status: DC | PRN
  Administered 2012-12-31: 1 via OPHTHALMIC

## 2012-12-31 MED ORDER — ONDANSETRON HCL 4 MG/2ML IJ SOLN
INTRAMUSCULAR | Status: AC
  Filled 2012-12-31: qty 2

## 2012-12-31 MED ORDER — OXYCODONE HCL 5 MG/5ML PO SOLN: 5.0000 mg | Freq: Once | ORAL | Status: DC | PRN

## 2012-12-31 MED ORDER — MIDAZOLAM HCL 5 MG/5ML IJ SOLN
INTRAMUSCULAR | Status: DC | PRN
  Administered 2012-12-31: 2 mg via INTRAVENOUS

## 2012-12-31 MED ORDER — LACTATED RINGERS IV SOLN
INTRAVENOUS | Status: DC | PRN
  Administered 2012-12-31 (×2): via INTRAVENOUS

## 2012-12-31 MED ORDER — DEXTROSE IN LACTATED RINGERS 5 % IV SOLN: 125.0000 mL | INTRAVENOUS | Status: DC

## 2012-12-31 MED ORDER — SODIUM BICARBONATE 4 % IV SOLN
Freq: Once | INTRAVENOUS | Status: DC
  Filled 2012-12-31 (×3): qty 50

## 2012-12-31 MED ORDER — PHENYLEPHRINE HCL 10 MG/ML IJ SOLN
INTRAMUSCULAR | Status: DC | PRN
  Administered 2012-12-31: 80 ug via INTRAVENOUS
  Administered 2012-12-31: 40 ug via INTRAVENOUS

## 2012-12-31 MED ORDER — DEXAMETHASONE SODIUM PHOSPHATE 10 MG/ML IJ SOLN
INTRAMUSCULAR | Status: DC | PRN
  Administered 2012-12-31: 4 mg via INTRAVENOUS

## 2012-12-31 MED ORDER — DEXTROSE IN LACTATED RINGERS 5 % IV SOLN: INTRAVENOUS | Status: DC

## 2012-12-31 MED ORDER — WHITE PETROLATUM GEL
Status: AC
  Administered 2012-12-31: 0.2
  Filled 2012-12-31: qty 5

## 2012-12-31 MED ORDER — OXYCODONE-ACETAMINOPHEN 5-325 MG PO TABS
2.0000 | ORAL_TABLET | ORAL | Status: DC | PRN
  Administered 2012-12-31 – 2013-01-01 (×4): 2 via ORAL
  Filled 2012-12-31 (×4): qty 2

## 2012-12-31 MED ORDER — ONDANSETRON HCL 4 MG/2ML IJ SOLN
INTRAMUSCULAR | Status: DC | PRN
  Administered 2012-12-31: 4 mg via INTRAVENOUS

## 2012-12-31 MED ORDER — MORPHINE SULFATE 2 MG/ML IJ SOLN: 2.0000 mg | INTRAMUSCULAR | Status: DC | PRN

## 2012-12-31 MED ORDER — CEFAZOLIN SODIUM 1-5 GM-% IV SOLN
1.0000 g | Freq: Three times a day (TID) | INTRAVENOUS | Status: AC
  Administered 2012-12-31 – 2013-01-01 (×2): 1 g via INTRAVENOUS
  Filled 2012-12-31 (×3): qty 50

## 2012-12-31 MED ORDER — ONDANSETRON HCL 4 MG/2ML IJ SOLN
4.0000 mg | Freq: Four times a day (QID) | INTRAMUSCULAR | Status: AC | PRN
  Administered 2012-12-31: 4 mg via INTRAVENOUS

## 2012-12-31 MED ORDER — CEFAZOLIN SODIUM-DEXTROSE 2-3 GM-% IV SOLR
INTRAVENOUS | Status: AC
  Filled 2012-12-31: qty 50

## 2012-12-31 MED ORDER — HYDROCODONE-ACETAMINOPHEN 5-325 MG PO TABS
1.0000 | ORAL_TABLET | ORAL | Status: DC | PRN
  Administered 2012-12-31: 2 via ORAL
  Filled 2012-12-31: qty 2

## 2012-12-31 MED ORDER — DIPHENHYDRAMINE HCL 25 MG PO CAPS
25.0000 mg | ORAL_CAPSULE | ORAL | Status: DC | PRN
  Administered 2012-12-31 – 2013-01-01 (×2): 25 mg via ORAL
  Filled 2012-12-31 (×2): qty 1

## 2012-12-31 MED ORDER — FENTANYL CITRATE 0.05 MG/ML IJ SOLN
INTRAMUSCULAR | Status: DC | PRN
  Administered 2012-12-31: 50 ug via INTRAVENOUS
  Administered 2012-12-31: 100 ug via INTRAVENOUS

## 2012-12-31 MED ORDER — SODIUM BICARBONATE 4 % IV SOLN
INTRAVENOUS | Status: DC | PRN
  Administered 2012-12-31: 09:00:00 via INTRAMUSCULAR

## 2012-12-31 MED ORDER — HYDROMORPHONE HCL PF 1 MG/ML IJ SOLN
0.2500 mg | INTRAMUSCULAR | Status: DC | PRN
  Administered 2012-12-31 (×3): 0.5 mg via INTRAVENOUS

## 2012-12-31 MED ORDER — SODIUM BICARBONATE 4 % IV SOLN
INTRAVENOUS | Status: DC | PRN
  Administered 2012-12-31: 08:00:00 via INTRAMUSCULAR

## 2012-12-31 MED ORDER — OXYCODONE HCL 5 MG PO TABS: 5.0000 mg | ORAL_TABLET | Freq: Once | ORAL | Status: DC | PRN

## 2012-12-31 MED ORDER — HYDROMORPHONE HCL PF 1 MG/ML IJ SOLN
INTRAMUSCULAR | Status: AC
Start: 2012-12-31 — End: 2012-12-31
  Administered 2012-12-31: 0.5 mg via INTRAVENOUS
  Filled 2012-12-31: qty 1

## 2012-12-31 SURGICAL SUPPLY — 58 items
ATCH SMKEVC FLXB CAUT HNDSWH (FILTER) ×1 IMPLANT
BAG DECANTER FOR FLEXI CONT (MISCELLANEOUS) ×2 IMPLANT
BENZOIN TINCTURE PRP APPL 2/3 (GAUZE/BANDAGES/DRESSINGS) ×4 IMPLANT
COTTONBALL LRG STERILE PKG (GAUZE/BANDAGES/DRESSINGS) ×2 IMPLANT
COVER SURGICAL LIGHT HANDLE (MISCELLANEOUS) ×2 IMPLANT
DRAIN CHANNEL 10F 3/8 F FF (DRAIN) IMPLANT
DRAPE LAPAROSCOPIC ABDOMINAL (DRAPES) ×2 IMPLANT
DRAPE WARM FLUID 44X44 (DRAPE) ×2 IMPLANT
DRSG PAD ABDOMINAL 8X10 ST (GAUZE/BANDAGES/DRESSINGS) ×2 IMPLANT
ELECT CAUTERY BLADE 6.4 (BLADE) ×2 IMPLANT
ELECT REM PT RETURN 9FT ADLT (ELECTROSURGICAL) ×2
ELECTRODE REM PT RTRN 9FT ADLT (ELECTROSURGICAL) ×1 IMPLANT
EVACUATOR PREFILTER SMOKE (MISCELLANEOUS) ×2 IMPLANT
EVACUATOR SILICONE 100CC (DRAIN) ×4 IMPLANT
EVACUATOR SMOKE ACCUVAC VALLEY (FILTER) ×1
GAUZE XEROFORM 5X9 LF (GAUZE/BANDAGES/DRESSINGS) ×2 IMPLANT
GLOVE BIO SURGEON STRL SZ7 (GLOVE) ×2 IMPLANT
GLOVE BIO SURGEON STRL SZ7.5 (GLOVE) ×10 IMPLANT
GLOVE BIOGEL PI IND STRL 7.0 (GLOVE) ×2 IMPLANT
GLOVE BIOGEL PI IND STRL 7.5 (GLOVE) ×3 IMPLANT
GLOVE BIOGEL PI INDICATOR 7.0 (GLOVE) ×2
GLOVE BIOGEL PI INDICATOR 7.5 (GLOVE) ×3
GLOVE ECLIPSE 7.0 STRL STRAW (GLOVE) ×4 IMPLANT
GLOVE SS BIOGEL STRL SZ 7 (GLOVE) ×1 IMPLANT
GLOVE SUPERSENSE BIOGEL SZ 7 (GLOVE) ×1
GLOVE SURG SS PI 7.0 STRL IVOR (GLOVE) ×4 IMPLANT
GOWN STRL NON-REIN LRG LVL3 (GOWN DISPOSABLE) ×6 IMPLANT
GOWN STRL REIN 2XL XLG LVL4 (GOWN DISPOSABLE) ×2 IMPLANT
KIT BASIN OR (CUSTOM PROCEDURE TRAY) ×2 IMPLANT
KIT ROOM TURNOVER OR (KITS) ×2 IMPLANT
MARKER SKIN DUAL TIP RULER LAB (MISCELLANEOUS) ×2 IMPLANT
NEEDLE SPNL 18GX3.5 QUINCKE PK (NEEDLE) ×4 IMPLANT
NS IRRIG 1000ML POUR BTL (IV SOLUTION) ×4 IMPLANT
PACK GENERAL/GYN (CUSTOM PROCEDURE TRAY) ×2 IMPLANT
PAD ARMBOARD 7.5X6 YLW CONV (MISCELLANEOUS) ×4 IMPLANT
PIN SAFETY STERILE (MISCELLANEOUS) ×2 IMPLANT
PREFILTER EVAC NS 1 1/3-3/8IN (MISCELLANEOUS) ×2 IMPLANT
PREFILTER SMOKE EVAC (FILTER) ×2 IMPLANT
SPECIMEN JAR LARGE (MISCELLANEOUS) ×4 IMPLANT
SPONGE GAUZE 4X4 12PLY (GAUZE/BANDAGES/DRESSINGS) ×4 IMPLANT
SPONGE LAP 18X18 X RAY DECT (DISPOSABLE) ×6 IMPLANT
STAPLER VISISTAT 35W (STAPLE) ×2 IMPLANT
STRIP CLOSURE SKIN 1/2X4 (GAUZE/BANDAGES/DRESSINGS) ×8 IMPLANT
SUT ETHILON 3 0 FSL (SUTURE) ×4 IMPLANT
SUT MNCRL AB 3-0 PS2 18 (SUTURE) ×10 IMPLANT
SUT MON AB 2-0 CT1 36 (SUTURE) ×8 IMPLANT
SUT MON AB 5-0 PS2 18 (SUTURE) ×4 IMPLANT
SUT PROLENE 1 XLH 60 (SUTURE) ×2 IMPLANT
SUT PROLENE 3 0 PS 1 (SUTURE) ×8 IMPLANT
SYR 50ML SLIP (SYRINGE) ×4 IMPLANT
TAPE CLOTH SURG 6X10 WHT LF (GAUZE/BANDAGES/DRESSINGS) ×2 IMPLANT
TOWEL OR 17X24 6PK STRL BLUE (TOWEL DISPOSABLE) ×4 IMPLANT
TOWEL OR 17X26 10 PK STRL BLUE (TOWEL DISPOSABLE) ×4 IMPLANT
TRAY FOLEY CATH 16FR SILVER (SET/KITS/TRAYS/PACK) ×2 IMPLANT
TUBE CONNECTING 12X1/4 (SUCTIONS) ×2 IMPLANT
TUBE CONNECTING 20X1/4 (TUBING) ×2 IMPLANT
TUBING 1/4  WITH 7/8  ADAPTER (MISCELLANEOUS) ×2 IMPLANT
YANKAUER SUCT BULB TIP NO VENT (SUCTIONS) ×4 IMPLANT

## 2012-12-31 NOTE — Brief Op Note (Signed)
12/31/2012  10:53 AM  PATIENT:  Megan Petty  52 y.o. female  PRE-OPERATIVE DIAGNOSIS:  PANNICULITIS  POST-OPERATIVE DIAGNOSIS:  PANNICULITIS  PROCEDURE:  Procedure(s): PANNICULECTOMY WITH LIPOSUCTION (N/A)  SURGEON:  Surgeon(s) and Role:    * Louisa Second, MD - Primary  PHYSICIAN ASSISTANT:   ASSISTANTS: none   ANESTHESIA:   general  EBL:  Total I/O In: 1000 [I.V.:1000] Out: 250 [Urine:150; Blood:100]  BLOOD ADMINISTERED:none  DRAINS: (large hemovac drain at pubic area) Hemovact drain(s) in the large hemovac at pubic area with  Suction Open   LOCAL MEDICATIONS USED:  LIDOCAINE   SPECIMEN:  Excision  DISPOSITION OF SPECIMEN:  PATHOLOGY  COUNTS:  YES  TOURNIQUET:  * No tourniquets in log *  DICTATION: .Other Dictation: Dictation Number [id number]  PLAN OF CARE: Admit to inpatient   PATIENT DISPOSITION:  PACU - hemodynamically stable.   Delay start of Pharmacological VTE agent (>24hrs) due to surgical blood loss or risk of bleeding: yes

## 2012-12-31 NOTE — Transfer of Care (Signed)
Immediate Anesthesia Transfer of Care Note  Patient: Megan Petty  Procedure(s) Performed: Procedure(s): PANNICULECTOMY WITH LIPOSUCTION (N/A)  Patient Location: PACU  Anesthesia Type:General  Level of Consciousness: awake, alert  and oriented  Airway & Oxygen Therapy: Patient Spontanous Breathing and Patient connected to nasal cannula oxygen  Post-op Assessment: Report given to PACU RN, Post -op Vital signs reviewed and stable and Patient moving all extremities X 4  Post vital signs: Reviewed and stable  Complications: No apparent anesthesia complications

## 2012-12-31 NOTE — H&P (Signed)
Breast Reduction Care After Refer to this sheet in the next few weeks. These instructions provide you with information on caring for yourself after your procedure. Your caregiver may also give you more specific instructions. Your treatment has been planned according to current medical practices, but problems sometimes occur. Call your caregiver if you have any problems or questions after your procedure. HOME CARE INSTRUCTIONS  Do not lift more than 5 pounds with one arm, or 10 pounds with both arms, for 1 month.   Do not sleep on your stomach for 4 to 6 weeks.   Do not do vigorous exercise such as bouncing, aerobics, or jumping for 6 weeks. Walking is not restricted.   Do not drive while you are taking prescription pain medicine.   Avoid prolonged sun exposure.   Keep dressings dry and clean  Measure jp drainage every 12 hrs and measure   You may slowly go back to your normal diet. Start with a light meal and increase as comfortable.   You may shower 24 hours after your drains are removed unless instructed differently by your caregiver.   Take your pain medicine as prescribed. Discomfort is normal after breast reduction surgery.   Keep the head of your bed elevated 40 degrees   : Call the office if you notice:  You have a fever.   You notice drainage from the incision that smells bad.   You have persistent pain.   You have persistent bleeding from the incision or nipple discharge.   You develop increased swelling or swelling that is greater in one breast than in the other.  MAKE SURE YOU:   Understand these instructions.   Will watch your condition.   Will get help right away if you are not doing well or get worse.  Document Released: 09/15/2003 Document Revised: 10/13/2010 Document Reviewed: 04/26/2007 [facility] Patient Information 2012 [place], [place].Breast Reduction Care After Refer to this sheet in the next few weeks. These instructions provide you with  information on caring for yourself after your procedure. Your caregiver may also give you more specific instructions. Your treatment has been planned according to current medical practices, but problems sometimes occur. Call your caregiver if you have any problems or questions after your procedure. HOME CARE INSTRUCTIONS  Do not lift more than 5 pounds with one arm, or 10 pounds with both arms, for 1 month.   Do not sleep on your stomach for 4 to 6 weeks.   Do not do vigorous exercise such as bouncing, aerobics, or jumping for 6 weeks. Walking is not restricted.   Do not drive while you are taking prescription pain medicine.   Avoid prolonged sun exposure.   Keep dressings dry and clean  Measure jp drainage every 12 hrs and measure   You may slowly go back to your normal diet. Start with a light meal and increase as comfortable.   You may shower 24 hours after your drains are removed unless instructed differently by your caregiver.   Take your pain medicine as prescribed. Discomfort is normal after breast reduction surgery.   Keep the head of your bed elevated 40 degrees   : Call the office if you notice:  You have a fever.   You notice drainage from the incision that smells bad.   You have persistent pain.   You have persistent bleeding from the incision or nipple discharge.   You develop increased swelling or swelling that is greater in one breast than in the  other.  MAKE SURE YOU:   Understand these instructions.   Will watch your condition.   Will get help right away if you are not doing well or get worse.  Document Released: 09/15/2003 Document Revised: 10/13/2010 Document Reviewed: 04/26/2007 [facility] Patient Information 2012 [place], [place].

## 2012-12-31 NOTE — Anesthesia Preprocedure Evaluation (Signed)
Anesthesia Evaluation  Patient identified by MRN, date of birth, ID band Patient awake    Reviewed: Allergy & Precautions, H&P , NPO status , Patient's Chart, lab work & pertinent test results  Airway Mallampati: II  Neck ROM: full    Dental   Pulmonary former smoker,          Cardiovascular hypertension,     Neuro/Psych    GI/Hepatic hiatal hernia, GERD-  ,  Endo/Other  diabetes, Type 2obese  Renal/GU      Musculoskeletal   Abdominal   Peds  Hematology   Anesthesia Other Findings   Reproductive/Obstetrics                           Anesthesia Physical Anesthesia Plan  ASA: II  Anesthesia Plan: General   Post-op Pain Management:    Induction: Intravenous  Airway Management Planned: Oral ETT  Additional Equipment:   Intra-op Plan:   Post-operative Plan: Extubation in OR  Informed Consent: I have reviewed the patients History and Physical, chart, labs and discussed the procedure including the risks, benefits and alternatives for the proposed anesthesia with the patient or authorized representative who has indicated his/her understanding and acceptance.     Plan Discussed with: CRNA, Anesthesiologist and Surgeon  Anesthesia Plan Comments:         Anesthesia Quick Evaluation

## 2012-12-31 NOTE — Anesthesia Procedure Notes (Signed)
Procedure Name: Intubation Date/Time: 12/31/2012 7:58 AM Performed by: Reine Just Pre-anesthesia Checklist: Patient identified, Emergency Drugs available, Suction available, Patient being monitored and Timeout performed Patient Re-evaluated:Patient Re-evaluated prior to inductionOxygen Delivery Method: Circle system utilized and Simple face mask Preoxygenation: Pre-oxygenation with 100% oxygen Intubation Type: IV induction Ventilation: Mask ventilation without difficulty Laryngoscope Size: Miller and 2 Grade View: Grade II Tube type: Oral Tube size: 7.0 mm Number of attempts: 1 Airway Equipment and Method: Patient positioned with wedge pillow and Stylet Placement Confirmation: ETT inserted through vocal cords under direct vision,  positive ETCO2 and breath sounds checked- equal and bilateral Secured at: 23 cm Tube secured with: Tape Dental Injury: Teeth and Oropharynx as per pre-operative assessment

## 2012-12-31 NOTE — OR Nursing (Signed)
Pre-Op assessment completed by K. Somers RN

## 2012-12-31 NOTE — H&P (Signed)
Megan Petty is an 52 y.o. female.   Chief complaintincreased panniculus HX  Of LARGE PANNICULU S with hx of intertrigo and skin breakdown and venteral hernia  Past Surgical History  Procedure Laterality Date  . Abdominal surgery    . Partial hysterectomy    . Gastric bypass sleeve      repaired 2 hernias at that time  . Melanoma excision    . Cesarean section    . Tonsillectomy    . Hernia repair    . Colonoscopy    . Abdominal hysterectomy      Family History  Problem Relation Age of Onset  . Colon cancer Mother   . Esophageal cancer Neg Hx   . Rectal cancer Neg Hx   . Stomach cancer Neg Hx    Social History:  reports that she quit smoking about 3 years ago. She has never used smokeless tobacco. She reports that she does not drink alcohol or use illicit drugs.  Allergies: No Known Allergies  Medications Prior to Admission  Medication Sig Dispense Refill  . [DISCONTINUED] hydrochlorothiazide (HYDRODIURIL) 25 MG tablet Take 25 mg by mouth daily.      . [DISCONTINUED] naproxen sodium (ANAPROX) 220 MG tablet Take 220 mg by mouth 2 (two) times daily as needed (for pain).        Results for orders placed during the hospital encounter of 12/31/12 (from the past 48 hour(s))  CBC     Status: Abnormal   Collection Time    12/31/12  6:55 AM      Result Value Range   WBC 7.1  4.0 - 10.5 K/uL   RBC 4.58  3.87 - 5.11 MIL/uL   Hemoglobin 13.8  12.0 - 15.0 g/dL   HCT 16.1  09.6 - 04.5 %   MCV 86.5  78.0 - 100.0 fL   MCH 30.1  26.0 - 34.0 pg   MCHC 34.8  30.0 - 36.0 g/dL   RDW 40.9  81.1 - 91.4 %   Platelets 132 (*) 150 - 400 K/uL  COMPREHENSIVE METABOLIC PANEL     Status: Abnormal   Collection Time    12/31/12  6:55 AM      Result Value Range   Sodium 139  135 - 145 mEq/L   Potassium 3.6  3.5 - 5.1 mEq/L   Chloride 105  96 - 112 mEq/L   CO2 24  19 - 32 mEq/L   Glucose, Bld 102 (*) 70 - 99 mg/dL   BUN 18  6 - 23 mg/dL   Creatinine, Ser 7.82  0.50 - 1.10 mg/dL   Calcium  8.9  8.4 - 95.6 mg/dL   Total Protein 6.4  6.0 - 8.3 g/dL   Albumin 3.2 (*) 3.5 - 5.2 g/dL   AST 15  0 - 37 U/L   ALT 12  0 - 35 U/L   Alkaline Phosphatase 60  39 - 117 U/L   Total Bilirubin 0.5  0.3 - 1.2 mg/dL   GFR calc non Af Amer 75 (*) >90 mL/min   GFR calc Af Amer 87 (*) >90 mL/min   Comment: (NOTE)     The eGFR has been calculated using the CKD EPI equation.     This calculation has not been validated in all clinical situations.     eGFR's persistently <90 mL/min signify possible Chronic Kidney     Disease.  PROTIME-INR     Status: None   Collection Time  12/31/12  6:55 AM      Result Value Range   Prothrombin Time 13.4  11.6 - 15.2 seconds   INR 1.04  0.00 - 1.49   No results found.  Review of Systems  Constitutional: Negative.   HENT: Negative.   Eyes: Negative.   Respiratory: Negative.   Cardiovascular: Negative.   Gastrointestinal: Negative.   Genitourinary: Negative.   Musculoskeletal: Positive for back pain.  Skin: Positive for rash.  Neurological: Negative.   Endo/Heme/Allergies: Negative.   Psychiatric/Behavioral: Negative.     Blood pressure 119/64, pulse 90, temperature 98.5 F (36.9 C), temperature source Oral, resp. rate 20, SpO2 95.00%. Physical Exam   Assessment/Plan  Severe panniculus with large ventral hernia  Foe panniculectomy with ventral hernia hernia repair and lipo assistance  Gadiel John L 12/31/2012, 11:37 AM

## 2012-12-31 NOTE — Preoperative (Signed)
Beta Blockers   Reason not to administer Beta Blockers:Not Applicable 

## 2012-12-31 NOTE — Anesthesia Postprocedure Evaluation (Signed)
Anesthesia Post Note  Patient: Megan Petty  Procedure(s) Performed: Procedure(s) (LRB): PANNICULECTOMY WITH LIPOSUCTION (N/A)  Anesthesia type: General  Patient location: PACU  Post pain: Pain level controlled and Adequate analgesia  Post assessment: Post-op Vital signs reviewed, Patient's Cardiovascular Status Stable, Respiratory Function Stable, Patent Airway and Pain level controlled  Last Vitals:  Filed Vitals:   12/31/12 1110  BP:   Pulse:   Temp: 36.9 C  Resp:     Post vital signs: Reviewed and stable  Level of consciousness: awake, alert  and oriented  Complications: No apparent anesthesia complications

## 2013-01-01 LAB — CBC
HCT: 31.5 % — ABNORMAL LOW (ref 36.0–46.0)
MCHC: 36.2 g/dL — ABNORMAL HIGH (ref 30.0–36.0)
Platelets: 132 10*3/uL — ABNORMAL LOW (ref 150–400)
RDW: 12.4 % (ref 11.5–15.5)
WBC: 5.8 10*3/uL (ref 4.0–10.5)

## 2013-01-01 LAB — BASIC METABOLIC PANEL
BUN: 12 mg/dL (ref 6–23)
Chloride: 103 mEq/L (ref 96–112)
GFR calc Af Amer: 81 mL/min — ABNORMAL LOW (ref >90)
GFR calc non Af Amer: 70 mL/min — ABNORMAL LOW (ref >90)
Potassium: 3.8 mEq/L (ref 3.5–5.1)
Sodium: 137 mEq/L (ref 135–145)

## 2013-01-01 MED ORDER — OXYCODONE-ACETAMINOPHEN 5-325 MG PO TABS: 2.0000 | ORAL_TABLET | ORAL | Status: DC | PRN

## 2013-01-01 NOTE — Op Note (Signed)
Megan Petty NO.:  [id number]  MEDICAL RECORD NO.:  [id number]  LOCATION:  6N13C                        FACILITY:  [facility]  PHYSICIAN:  Yaakov Guthrie. Sumaiya Arruda, M.D.DATE OF BIRTH:  11-26-1960  DATE OF PROCEDURE:  12/31/2012 DATE OF DISCHARGE:                              OPERATIVE REPORT   INDICATIONS:  This is a 52 year old lady, who had bariatric surgery, and loss of 170 pounds.  As result, she has skin hanging everywhere, has history of severe panniculitis.  She has had use of medications for intertriginous changes in her inguinal area as well as having to use multiple __________ screens and other types of lotions, etc., to keep the areas under good control.  She is set now for excision of the panniculus as well as a ventral hernia that is obvious on physical examination.  It measured approximately 8 x 6 cm in the periumbilical area, and liposuction assistance to the right and left sides and abdominal areas.  PROCEDURE:  __________.  ANESTHESIA:  General.  DESCRIPTION OF PROCEDURE:  Preoperatively, the patient underwent a full discussion of the proposed procedures.  All procedure in detail as well as no 100% guarantee of results were explained.  The patient understands and consents __________ surgery.  Prior to taken to the operating room, she underwent drawings to the low abdomen and shape of the W plasty type incision.  She then was taken to the OR, placed on OR table in the supine position, was given adequate general anesthesia, intubated orally.  Prep was done to abdominal breasts and groin and thigh areas using Hibiclens soap and solution, walled off with sterile towels and drapes so as to make a sterile field.  Tumescent was injected locally to the areas total of 1000 mL also local 0.5% with epinephrine was instilled locally on the incision line areas and a total of 50 mL 1:200,000 concentration.  A Foley catheter was also placed sterilely. The  incision was made in the groin area using the Bovie on a cutting. Hemostasis was maintained with the Bovie anticoagulation as we gone across, the elevated flaps up to the umbilicus and the areolar tissue and released umbilicus on its pedicle and then dissected up to the xiphoid process, as well as the right and left upper quadrants. Hemostasis again maintained with the Bovie anticoagulation.  Next liposuction was fashioned and laterally removed where 800 mL of materials.  After this, the examination was done, an umbilical herniation of a large ventral hernia in the area looks about 4 sutures of Mersilene suture passed through.  These were removed and then the repair was done with multiple sutures of 2-0 Surgilon.  Next all spaces which is 4+ was repaired as well with a running suture of #1 Prolene from the xiphoid process to the umbilicus and from the umbilicus to the suprapubic area.  After this, the patient was placed in jack-knife position, and then she had excess skin removed in tremendous amounts over approximately 20 pounds.  After this, I was able to repair the flaps and then as I was able to repair the subcutaneous tissue with multiple sutures of 2-0 Monocryl, and then skin edges were approximated  with running subcuticular stitch of 3-0 Monocryl, and the decision was made at the new umbilicus areas, repaired with 2-0 Monocryl subcutaneously with a running 3-0 nylon.  Next the wounds were drained with a large Hemovac drain, was placed in the pubic area and secured with 3-0 nylon.  The wounds were cleansed.  Steri-Strips, Xeroform, 4x4s, ABDs, and Hypafix tape were applied as well as abdominal garment. She tolerated all the procedures very well, was taken recovery in excellent condition.  No complications.  ESTIMATED BLOOD LOSS:  350 mL.  COMPLICATIONS:  None.     Yaakov Guthrie. Shon Hough, M.D.     Cathie Hoops  D:  12/31/2012  T:  01/01/2013  Job:  [id number]

## 2013-01-01 NOTE — Discharge Planning (Signed)
Patient discharged home in stable condition. Verbalizes understanding of all discharge instructions, including home medications and follow up appointments. 

## 2013-01-02 ENCOUNTER — Encounter (HOSPITAL_COMMUNITY): Payer: Self-pay | Admitting: Specialist

## 2013-01-22 NOTE — Discharge Summary (Signed)
NAMEALLISYN, KUNZ NO.:  [id number]  MEDICAL RECORD NO.:  [id number]  LOCATION:  6N13C                        FACILITY:  [facility]  PHYSICIAN:  Yaakov Guthrie. Shon Hough, M.D.DATE OF BIRTH:  1960/07/06  DATE OF ADMISSION:  12/31/2012 DATE OF DISCHARGE:  01/01/2013                              DISCHARGE SUMMARY   HISTORY OF PRESENT ILLNESS:  The patient admitted for panniculectomy as well as large ventral hernia and diastasis recti on December 31, 2012. She did well postoperatively after removing over 20 some pounds from abdomen and was very alert and orientated on postop day #1, and was discharged without any problems.  DISCHARGE DIAGNOSES:  Severe panniculitis with panniculus status post excision.  PREOPERATIVE DIAGNOSES:  Panniculitis and panniculus with ventral hernia.  CONDITION AT DISCHARGE:  Improved.  COMPLICATIONS:  None.  MEDICATIONS:  Hydrocodone 325-7.5 mg 1 tab by mouth every 3-4 hours as needed for pain #30.  Keflex 500 mg q.6 h. x7 days, no refills.  FOLLOWUP:  My office in 3 days.  INFORMATION THE PATIENT:  The patient is to call me if any medical problems.  She has been given my cell number as well to call for any questions that may come up.  For her drainage, she has been given a sheet to keep up within the drainage on a daily basis 3 times a day. All questions were answered.  CONDITION AT DISCHARGE:  Improved.     Yaakov Guthrie. Shon Hough, M.D.     Cathie Hoops  D:  01/21/2013  T:  01/22/2013  Job:  [id number]

## 2013-01-29 ENCOUNTER — Encounter (HOSPITAL_COMMUNITY): Payer: Self-pay | Admitting: Specialist

## 2013-01-29 NOTE — OR Nursing (Signed)
Addendum to times

## 2013-10-08 ENCOUNTER — Other Ambulatory Visit: Payer: Self-pay | Admitting: Family Medicine

## 2013-10-08 DIAGNOSIS — R1310 Dysphagia, unspecified: Secondary | ICD-10-CM

## 2013-10-10 ENCOUNTER — Other Ambulatory Visit: Payer: Managed Care, Other (non HMO)

## 2013-10-11 ENCOUNTER — Ambulatory Visit
Admission: RE | Admit: 2013-10-11 | Discharge: 2013-10-11 | Disposition: A | Discharge status: Home / Self Care | Payer: Managed Care, Other (non HMO) | Source: Ambulatory Visit | Attending: Family Medicine | Admitting: Family Medicine

## 2013-10-11 DIAGNOSIS — R1310 Dysphagia, unspecified: Secondary | ICD-10-CM

## 2014-07-07 ENCOUNTER — Telehealth: Payer: Self-pay | Admitting: Genetic Counselor

## 2014-07-07 NOTE — Telephone Encounter (Signed)
called pt and scheduled new pt gen counseling appt.    Megan Petty 07/21/14 1 pm  Dx: Family hx of breast cancer Referring:  Dr. Radene Knee

## 2014-07-21 ENCOUNTER — Other Ambulatory Visit: Payer: Managed Care, Other (non HMO)

## 2014-07-21 ENCOUNTER — Telehealth: Payer: Self-pay | Admitting: Genetic Counselor

## 2014-07-21 ENCOUNTER — Encounter: Payer: Managed Care, Other (non HMO) | Admitting: Genetic Counselor

## 2014-07-21 NOTE — Telephone Encounter (Signed)
Patient called in to reschedule her appointment °

## 2014-07-28 ENCOUNTER — Ambulatory Visit (HOSPITAL_BASED_OUTPATIENT_CLINIC_OR_DEPARTMENT_OTHER): Payer: Managed Care, Other (non HMO) | Admitting: Genetic Counselor

## 2014-07-28 ENCOUNTER — Encounter: Payer: Self-pay | Admitting: Genetic Counselor

## 2014-07-28 ENCOUNTER — Other Ambulatory Visit: Payer: Managed Care, Other (non HMO)

## 2014-07-28 DIAGNOSIS — Z8041 Family history of malignant neoplasm of ovary: Secondary | ICD-10-CM

## 2014-07-28 DIAGNOSIS — Z803 Family history of malignant neoplasm of breast: Secondary | ICD-10-CM | POA: Diagnosis not present

## 2014-07-28 DIAGNOSIS — Z800 Family history of malignant neoplasm of digestive organs: Secondary | ICD-10-CM

## 2014-07-28 DIAGNOSIS — C439 Malignant melanoma of skin, unspecified: Secondary | ICD-10-CM

## 2014-07-28 DIAGNOSIS — K635 Polyp of colon: Secondary | ICD-10-CM | POA: Insufficient documentation

## 2014-07-28 DIAGNOSIS — Z8051 Family history of malignant neoplasm of kidney: Secondary | ICD-10-CM | POA: Diagnosis not present

## 2014-07-28 DIAGNOSIS — Z86010 Personal history of colonic polyps: Secondary | ICD-10-CM

## 2014-07-28 NOTE — Progress Notes (Signed)
REFERRING PROVIDER: Baruch Goldmann, PA-C [place], [place] [address]   Dr. Radene Knee  PRIMARY PROVIDER:  Baruch Goldmann, PA-C  PRIMARY REASON FOR VISIT:  1. Family history of breast cancer   2. Family history of ovarian cancer   3. Family history of pancreatic cancer   4. Family history of colon cancer   5. Family history of kidney cancer   6. Melanoma of skin   7. History of colonic polyps      HISTORY OF PRESENT ILLNESS:   Megan Petty, a 54 y.o. female, was seen for a [place] [facility] cancer genetics consultation at the request of Dr. Crista Curb due to a family history of cancer.  [name] presents to clinic today to discuss the possibility of a hereditary predisposition to cancer, genetic testing, and to further clarify her future cancer risks, as well as potential cancer risks for family members. [name] was diagnosed with Melanoma at age 5. No chemotherapy was needed.  CANCER HISTORY:   No history exists.     HORMONAL RISK FACTORS:  Menarche was at age 61-18.  First live birth at age 7.  OCP use for approximately less than 2 years.  Ovaries intact: yes.  Hysterectomy: yes.  Menopausal status: perimenopausal.  HRT use: 0 years. Colonoscopy: yes; 2-3 polyps. Mammogram within the last year: yes. Number of breast biopsies: 0. Up to date with pelvic exams:  yes. Any excessive radiation exposure in the past:  no  Past Medical History  Diagnosis Date  . Hypertension   . Allergy   . Asthma     seasonal  . Cancer     melanoma- left leg  . Diabetes mellitus without complication     hx, not any longer  . H/O hiatal hernia     had surgery to fix with last surgery  . GERD (gastroesophageal reflux disease)   . Family history of breast cancer   . Family history of ovarian cancer   . Family history of pancreatic cancer   . Family history of colon cancer   . Colon polyps     Past Surgical History  Procedure Laterality Date  . Abdominal surgery    .  Partial hysterectomy    . Gastric bypass sleeve      repaired 2 hernias at that time  . Melanoma excision    . Cesarean section    . Tonsillectomy    . Hernia repair    . Colonoscopy    . Abdominal hysterectomy    . Abdominoplasty/panniculectomy with liposuction N/A 12/31/2012    Procedure: PANNICULECTOMY WITH LIPOSUCTION;  Surgeon: Cristine Polio, MD;  Location: [facility];  Service: Plastics;  Laterality: N/A;    History   Social History  . Marital Status: Married    Spouse Name: N/A  . Number of Children: 2  . Years of Education: N/A   Social History Main Topics  . Smoking status: Former Smoker    Quit date: 02/14/2009  . Smokeless tobacco: Never Used  . Alcohol Use: No  . Drug Use: No  . Sexual Activity: Not on file   Other Topics Concern  . None   Social History Narrative     FAMILY HISTORY:  We obtained a detailed, 4-generation family history.  Significant diagnoses are listed below: Family History  Problem Relation Age of Onset  . Colon cancer Mother     gallbladder cancer spread to colon  . Cancer Mother 40    gallbladder  .  Esophageal cancer Neg Hx   . Rectal cancer Neg Hx   . Stomach cancer Neg Hx   . Lung cancer Father 50  . Pancreatic cancer Brother 41    Maternal 1/2 brother  . Breast cancer Paternal Aunt 54  . Kidney cancer Maternal Grandmother   . Lung cancer Maternal Grandfather   . Breast cancer Paternal Grandmother     dx in her 30s  . Ovarian cancer Paternal Grandmother     dx in her 56s   The patient has two children who are healthy.  She has one full sister who is cancer free, and a maternal 1/2 brother who recently passed away with pancreatic cancer at age 38.  Her mother was diagnosed with gallbladder cancer at 52 that spread to the colon and died at 70.  Her mother had one sister and two brothers.  One brother died in a MVA in his 65s, her sister has a muscle wasting disdase, possibly myositis, and her other uncle has skin cancers.  Her  maternal grandmother died of kidney cancer at 75 and her grandfather died of lung cancer from smoking in his 74s.  Her father was a smoker and died of lung cancer at 78.  He had one full sister and several half siblings.  The full sister had breast cancer at 54.  Her paternal grandmother was diagnosed with breast cancer in her 47s and ovarian cancer in her 35s, and died in her 64s.   Patient's ancestors are of Caucausain descent. There is no reported Ashkenazi Jewish ancestry. There is no known consanguinity.  GENETIC COUNSELING ASSESSMENT: SHAUNTEL PREST is a 54 y.o. female with a personal and family history of cancer which somewhat suggestive of a hereditary cancer syndrome and predisposition to cancer. We, therefore, discussed and recommended the following at today's visit.   DISCUSSION: We reviewed the characteristics, features and inheritance patterns of hereditary cancer syndromes. Based on her paternal family history she is at risk for hereditary breast cancer syndromes such as BRCA mutations.  Based on her maternal family history she is at risk for GI cancer syndromes that involve pancreatic cancer which could include Lynch syndrome or others.  We also discussed genetic testing, including the appropriate family members to test, the process of testing, insurance coverage and turn-around-time for results. We discussed the implications of a negative, positive and/or variant of uncertain significant result. We recommended Ms. Krul pursue genetic testing for the custom gene panel that includes genes from the breast/ovarian cancer panel and pancreatic cancer syndrome.  The custom gene panel offered by [facility] includes sequencing and rearrangement analysis for the following 21 genes:  APC, ATM, BARD1, BRCA1, BRCA2, BRIP1, CDH1, CDK4, CDKN2A, CHEK2, EPCAM, FANCC, MLH1, MSH2, MSH6, NBN, PALB2, PMS2, PTEN, RAD51C, RAD51D, STK11, TP53, VHL and XRCC2.      Based on Ms. Carrigg's personal and family history of  cancer, she meets medical criteria for genetic testing. Despite that she meets criteria, she may still have an out of pocket cost. We discussed that if her out of pocket cost for testing is over $100, the laboratory will call and confirm whether she wants to proceed with testing.  If the out of pocket cost of testing is less than $100 she will be billed by the genetic testing laboratory.   In order to estimate her chance of having a BRCA mutation, we used statistical models (Penn II, Myriad risk calculator and [employer]) and laboratory data that take into account her  personal medical history, family history and ancestry.  Because each model is different, there can be a lot of variability in the risks they give.  Therefore, these numbers must be considered a rough range and not a precise risk of having a BRCA mutation.  These models estimate that she has approximately a 7-12% chance of having a mutation. Based on this assessment of her family and personal history, genetic testing is recommended.  Based on the patient's personal and family history, statistical models (Tyrer Cusik)  and literature data were used to estimate her risk of developing breast cancer. This estimates her lifetime risk of developing breast cancer to be approximately 19.3%. This estimation does not take into account any genetic testing results.  The patient's lifetime breast cancer risk is a preliminary estimate based on available information using one of several models endorsed by the [place] (ACS). The ACS recommends consideration of breast MRI screening as an adjunct to mammography for patients at high risk (defined as 20% or greater lifetime risk). A more detailed breast cancer risk assessment can be considered, if clinically indicated.   PLAN: After considering the risks, benefits, and limitations, Ms. Lichty  provided informed consent to pursue genetic testing and the blood sample was sent to [facility] for  analysis of the Custom panel. Results should be available within approximately 2-3 weeks' time, at which point they will be disclosed by telephone to Ms. Spadaccini, as will any additional recommendations warranted by these results. Ms. Avery will receive a summary of her genetic counseling visit and a copy of her results once available. This information will also be available in Epic. We encouraged Ms. Schwier to remain in contact with cancer genetics annually so that we can continuously update the family history and inform her of any changes in cancer genetics and testing that may be of benefit for her family. Ms. Minckler questions were answered to her satisfaction today. Our contact information was provided should additional questions or concerns arise.  Lastly, we encouraged Ms. Buster to remain in contact with cancer genetics annually so that we can continuously update the family history and inform her of any changes in cancer genetics and testing that may be of benefit for this family.   Ms.  Shippee questions were answered to her satisfaction today. Our contact information was provided should additional questions or concerns arise. Thank you for the referral and allowing [place] to share in the care of your patient.   Karen P. Florene Glen, [place], [facility] Certified Genetic Counselor [email or web address] phone: [phone]  The patient was seen for a total of 60 minutes in face-to-face genetic counseling.  This patient was discussed with Drs. Magrinat, Lindi Adie and/or Burr Medico who agrees with the above.    _______________________________________________________________________ For Office Staff:  Number of people involved in session: 1 Was an Intern/ student involved with case: yes

## 2014-08-08 ENCOUNTER — Encounter: Payer: Self-pay | Admitting: Genetic Counselor

## 2014-08-08 ENCOUNTER — Telehealth: Payer: Self-pay | Admitting: Genetic Counselor

## 2014-08-08 DIAGNOSIS — Z8041 Family history of malignant neoplasm of ovary: Secondary | ICD-10-CM

## 2014-08-08 DIAGNOSIS — Z1379 Encounter for other screening for genetic and chromosomal anomalies: Secondary | ICD-10-CM | POA: Insufficient documentation

## 2014-08-08 DIAGNOSIS — K635 Polyp of colon: Secondary | ICD-10-CM

## 2014-08-08 DIAGNOSIS — Z803 Family history of malignant neoplasm of breast: Secondary | ICD-10-CM

## 2014-08-08 DIAGNOSIS — Z800 Family history of malignant neoplasm of digestive organs: Secondary | ICD-10-CM

## 2014-08-08 NOTE — Progress Notes (Signed)
HPI: Ms. Vanhook was previously seen in the [facility] clinic due to a family history of cancer and concerns regarding a hereditary predisposition to cancer. Please refer to our prior cancer genetics clinic note for more information regarding Ms. [facility] medical, social and family histories, and our assessment and recommendations, at the time. Ms. Kirshner recent genetic test results were disclosed to her, as were recommendations warranted by these results. These results and recommendations are discussed in more detail below.  GENETIC TEST RESULTS: At the time of Ms. Mazzarella's visit, we recommended she pursue genetic testing of the custom gene panel that would look at genes associated with breast, ovarian, pancreatic cancer and melanoma. The Custom gene panel offered by [facility] includes sequencing and rearrangement analysis for the following 25 genes:  APC, ATM, BARD1, BRCA1, BRCA2, BRIP1, CDH1, CHEK2, CDK4, CDKN2A, EPCAM, FANCC, MLH1, MSH2, MSH6, NBN, PALB2, PMS2, PTEN, RAD51C, RAD51D, STK11, TP53, and XRCC2.     The report date is August 07, 2014.  Genetic testing was normal, and did not reveal a deleterious mutation in these genes. The test report has been scanned into EPIC and is located under the Media tab.   We discussed with Ms. Pederson that since the current genetic testing is not perfect, it is possible there may be a gene mutation in one of these genes that current testing cannot detect, but that chance is small. We also discussed, that it is possible that another gene that has not yet been discovered, or that we have not yet tested, is responsible for the cancer diagnoses in the family, and it is, therefore, important to remain in touch with cancer genetics in the future so that we can continue to offer Ms. Blankenburg the most up to date genetic testing.   CANCER SCREENING RECOMMENDATIONS: Given Ms. Kranz's personal and family histories, we must interpret these negative results with some  caution.  Families with features suggestive of hereditary risk for cancer tend to have multiple family members with cancer, diagnoses in multiple generations and diagnoses before the age of 42. Ms. Alvis's family exhibits some of these features. Thus this result may simply reflect our current inability to detect all mutations within these genes or there may be a different gene that has not yet been discovered or tested.   RECOMMENDATIONS FOR FAMILY MEMBERS: Women in this family might be at some increased risk of developing cancer, over the general population risk, simply due to the family history of cancer. We recommended women in this family have a yearly mammogram beginning at age 62, or 63 years younger than the earliest onset of cancer, an an annual clinical breast exam, and perform monthly breast self-exams. Women in this family should also have a gynecological exam as recommended by their primary provider. All family members should have a colonoscopy by age 44.  Based on Ms. Petersheim's family history, we recommended her paternal aunt June, who was diagnosed with breast cancer at age 55, have genetic counseling and testing. Ms. Geissinger will let us know if we can be of any assistance in coordinating genetic counseling and/or testing for this family member.   FOLLOW-UP: Lastly, we discussed with Ms. Sharples that cancer genetics is a rapidly advancing field and it is possible that new genetic tests will be appropriate for her and/or her family members in the future. We encouraged her to remain in contact with cancer genetics on an annual basis so we can update her personal and family histories and let  her know of advances in cancer genetics that may benefit this family.   Our contact number was provided. Ms. Defilippo's questions were answered to her satisfaction, and she knows she is welcome to call us at anytime with additional questions or concerns.   Roma Kayser, MS, [facility] Certified Genetic  Counselor [email or web address]

## 2014-08-08 NOTE — Telephone Encounter (Signed)
Revealed negative genetic testing on a custom gene panel.  Discussed having her paternal aunt undergo genetic testing based on her early onset of breast cancer to determine whether there is a hereditary cancer syndrome running on Megan Petty's side of the family. She will contact me if she needs help finding a provider.

## 2014-09-08 DIAGNOSIS — N200 Calculus of kidney: Secondary | ICD-10-CM | POA: Insufficient documentation

## 2014-10-07 NOTE — Progress Notes (Signed)
I DID NOT PRESCRIBE

## 2015-03-18 DIAGNOSIS — E669 Obesity, unspecified: Secondary | ICD-10-CM | POA: Diagnosis not present

## 2015-03-18 DIAGNOSIS — J45909 Unspecified asthma, uncomplicated: Secondary | ICD-10-CM | POA: Insufficient documentation

## 2015-03-18 DIAGNOSIS — L905 Scar conditions and fibrosis of skin: Secondary | ICD-10-CM | POA: Insufficient documentation

## 2015-03-18 DIAGNOSIS — Z90710 Acquired absence of both cervix and uterus: Secondary | ICD-10-CM | POA: Diagnosis not present

## 2015-03-18 DIAGNOSIS — Z87891 Personal history of nicotine dependence: Secondary | ICD-10-CM | POA: Insufficient documentation

## 2015-03-18 DIAGNOSIS — Z85820 Personal history of malignant melanoma of skin: Secondary | ICD-10-CM | POA: Diagnosis not present

## 2015-03-18 DIAGNOSIS — Z86010 Personal history of colonic polyps: Secondary | ICD-10-CM | POA: Insufficient documentation

## 2015-03-18 DIAGNOSIS — R1032 Left lower quadrant pain: Secondary | ICD-10-CM | POA: Diagnosis not present

## 2015-03-18 DIAGNOSIS — Z9889 Other specified postprocedural states: Secondary | ICD-10-CM | POA: Insufficient documentation

## 2015-03-18 DIAGNOSIS — E119 Type 2 diabetes mellitus without complications: Secondary | ICD-10-CM | POA: Diagnosis not present

## 2015-03-18 DIAGNOSIS — Z8719 Personal history of other diseases of the digestive system: Secondary | ICD-10-CM | POA: Insufficient documentation

## 2015-03-18 DIAGNOSIS — I10 Essential (primary) hypertension: Secondary | ICD-10-CM | POA: Insufficient documentation

## 2015-03-19 ENCOUNTER — Emergency Department (HOSPITAL_BASED_OUTPATIENT_CLINIC_OR_DEPARTMENT_OTHER)
Admission: EM | Admit: 2015-03-19 | Discharge: 2015-03-19 | Disposition: A | Discharge status: Home / Self Care | Payer: Managed Care, Other (non HMO) | Attending: Emergency Medicine | Admitting: Emergency Medicine

## 2015-03-19 ENCOUNTER — Emergency Department (HOSPITAL_BASED_OUTPATIENT_CLINIC_OR_DEPARTMENT_OTHER): Payer: Managed Care, Other (non HMO)

## 2015-03-19 ENCOUNTER — Encounter (HOSPITAL_BASED_OUTPATIENT_CLINIC_OR_DEPARTMENT_OTHER): Payer: Self-pay

## 2015-03-19 DIAGNOSIS — R109 Unspecified abdominal pain: Secondary | ICD-10-CM

## 2015-03-19 LAB — BASIC METABOLIC PANEL
ANION GAP: 7 (ref 5–15)
BUN: 28 mg/dL — ABNORMAL HIGH (ref 6–20)
CHLORIDE: 106 mmol/L (ref 101–111)
CO2: 29 mmol/L (ref 22–32)
Calcium: 8.8 mg/dL — ABNORMAL LOW (ref 8.9–10.3)
Creatinine, Ser: 1.06 mg/dL — ABNORMAL HIGH (ref 0.44–1.00)
GFR, EST NON AFRICAN AMERICAN: 59 mL/min — AB (ref >60)
Glucose, Bld: 98 mg/dL (ref 65–99)
POTASSIUM: 4.3 mmol/L (ref 3.5–5.1)
SODIUM: 142 mmol/L (ref 135–145)

## 2015-03-19 LAB — CBC WITH DIFFERENTIAL/PLATELET
Basophils Absolute: 0 10*3/uL (ref 0.0–0.1)
Basophils Relative: 1 %
EOS PCT: 2 %
Eosinophils Absolute: 0.1 10*3/uL (ref 0.0–0.7)
HCT: 38.9 % (ref 36.0–46.0)
HEMOGLOBIN: 12.7 g/dL (ref 12.0–15.0)
LYMPHS ABS: 2.2 10*3/uL (ref 0.7–4.0)
LYMPHS PCT: 34 %
MCH: 28.9 pg (ref 26.0–34.0)
MCHC: 32.6 g/dL (ref 30.0–36.0)
MCV: 88.6 fL (ref 78.0–100.0)
Monocytes Absolute: 0.8 10*3/uL (ref 0.1–1.0)
Monocytes Relative: 12 %
NEUTROS PCT: 51 %
Neutro Abs: 3.4 10*3/uL (ref 1.7–7.7)
PLATELETS: 191 10*3/uL (ref 150–400)
RBC: 4.39 MIL/uL (ref 3.87–5.11)
RDW: 12.6 % (ref 11.5–15.5)
WBC: 6.6 10*3/uL (ref 4.0–10.5)

## 2015-03-19 LAB — URINALYSIS, ROUTINE W REFLEX MICROSCOPIC
Bilirubin Urine: NEGATIVE
GLUCOSE, UA: NEGATIVE mg/dL
Hgb urine dipstick: NEGATIVE
Ketones, ur: NEGATIVE mg/dL
Nitrite: NEGATIVE
PROTEIN: NEGATIVE mg/dL
Specific Gravity, Urine: 1.03 (ref 1.005–1.030)
pH: 5.5 (ref 5.0–8.0)

## 2015-03-19 LAB — URINE MICROSCOPIC-ADD ON

## 2015-03-19 MED ORDER — IOHEXOL 300 MG/ML  SOLN
25.0000 mL | Freq: Once | INTRAMUSCULAR | Status: AC | PRN
  Administered 2015-03-19: 25 mL via ORAL

## 2015-03-19 MED ORDER — IOHEXOL 300 MG/ML  SOLN
100.0000 mL | Freq: Once | INTRAMUSCULAR | Status: AC | PRN
  Administered 2015-03-19: 100 mL via INTRAVENOUS

## 2015-03-19 NOTE — ED Provider Notes (Signed)
CSN: [id number]     Arrival date & time 03/18/15  2343 History   First MD Initiated Contact with Patient 03/19/15 0041     Chief Complaint  Patient presents with  . Abdominal Pain     (Consider location/radiation/quality/duration/timing/severity/associated sxs/prior Treatment) HPI Comments: Patient is a 55 year old female with history of obesity. She is status post gastric sleeve with significant weight loss resulting in a panniculectomy 2 years ago. She's been doing fine up until several days ago when she developed pain in her left lower quadrant. She reports feeling something sharp in her left lower quadrant. She denies any vomiting, diarrhea, or constipation. She denies any fevers or chills. She denies any bloody stool or urinary complaints.  Patient is a 55 y.o. female presenting with abdominal pain. The history is provided by the patient.  Abdominal Pain Pain location:  LLQ Pain quality: stabbing   Pain radiates to:  Does not radiate Pain severity:  Moderate Onset quality:  Sudden Duration:  3 days Timing:  Constant Progression:  Worsening Chronicity:  New Relieved by:  Nothing Worsened by:  Nothing tried Ineffective treatments:  None tried   Past Medical History  Diagnosis Date  . Hypertension   . Allergy   . Asthma     seasonal  . Cancer ([place])     melanoma- left leg  . Diabetes mellitus without complication (HCC)     hx, not any longer  . H/O hiatal hernia     had surgery to fix with last surgery  . GERD (gastroesophageal reflux disease)   . Family history of breast cancer   . Family history of ovarian cancer   . Family history of pancreatic cancer   . Family history of colon cancer   . Colon polyps    Past Surgical History  Procedure Laterality Date  . Abdominal surgery    . Partial hysterectomy    . Gastric bypass sleeve      repaired 2 hernias at that time  . Melanoma excision    . Cesarean section    . Tonsillectomy    . Hernia repair    .  Colonoscopy    . Abdominal hysterectomy    . Abdominoplasty/panniculectomy with liposuction N/A 12/31/2012    Procedure: PANNICULECTOMY WITH LIPOSUCTION;  Surgeon: Cristine Polio, MD;  Location: [place];  Service: Plastics;  Laterality: N/A;   Family History  Problem Relation Age of Onset  . Colon cancer Mother     gallbladder cancer spread to colon  . Cancer Mother 34    gallbladder  . Esophageal cancer Neg Hx   . Rectal cancer Neg Hx   . Stomach cancer Neg Hx   . Lung cancer Father 79  . Pancreatic cancer Brother 64    Maternal 1/2 brother  . Breast cancer Paternal Aunt 82  . Kidney cancer Maternal Grandmother   . Lung cancer Maternal Grandfather   . Breast cancer Paternal Grandmother     dx in her 38s  . Ovarian cancer Paternal Grandmother     dx in her 32s   Social History  Substance Use Topics  . Smoking status: Former Smoker    Quit date: 02/14/2009  . Smokeless tobacco: Never Used  . Alcohol Use: No   OB History    No data available     Review of Systems  Gastrointestinal: Positive for abdominal pain.  All other systems reviewed and are negative.     Allergies  Vicodin  Home Medications  Prior to Admission medications   Not on File   BP 124/87 mmHg  Pulse 75  Temp(Src) 98 F (36.7 C) (Oral)  Resp 18  Ht 5\' 8"  (1.727 m)  Wt 205 lb (92.987 kg)  BMI 31.18 kg/m2  SpO2 99% Physical Exam  Constitutional: She is oriented to person, place, and time. She appears well-developed and well-nourished. No distress.  HENT:  Head: Normocephalic and atraumatic.  Neck: Normal range of motion. Neck supple.  Cardiovascular: Normal rate and regular rhythm.  Exam reveals no gallop and no friction rub.   No murmur heard. Pulmonary/Chest: Effort normal and breath sounds normal. No respiratory distress. She has no wheezes.  Abdominal: Soft. Bowel sounds are normal. She exhibits no distension. There is tenderness.  There is tenderness to palpation in the left lower  quadrant. There is a surgical scar running across the lower abdomen. On the left and of the scar, there is a palpable abnormality. There are 2 sharp points noted. This appears to reproduce her discomfort.  Musculoskeletal: Normal range of motion.  Neurological: She is alert and oriented to person, place, and time.  Skin: Skin is warm and dry. She is not diaphoretic.  Nursing note and vitals reviewed.   ED Course  Procedures (including critical care time) Labs Review Labs Reviewed  BASIC METABOLIC PANEL  CBC WITH DIFFERENTIAL/PLATELET  URINALYSIS, ROUTINE W REFLEX MICROSCOPIC (NOT AT [facility])    Imaging Review No results found. I have personally reviewed and evaluated these images and lab results as part of my medical decision-making.    MDM   Final diagnoses:  None    Patient with history of gastric sleeve surgery with significant weight loss and eventually requiring a panniculectomy. She presents today with pain in the left lower quadrant underlying her incisional scar. She is concerned that she feels something under the skin in this area. There is a palpable firm knot. CT scan reveals this to be mesh with scarring, however shows no other acute intra-abdominal or abdominal wall process. She will be discharged home with follow-up with general surgery if this continues to bother her.    Veryl Speak, MD 03/19/15 [phone]

## 2015-03-19 NOTE — ED Notes (Signed)
Pt c/o "sharp, poking" sensation in lower left abdomen that started on Monday, feels like "something is there," denies n/v/d, denies dysuria, denies fever.

## 2015-03-19 NOTE — ED Notes (Signed)
Patient transported to CT 

## 2015-03-19 NOTE — Discharge Instructions (Signed)
Call [facility] [place] [facility] to arrange a follow-up appointment if you do not improve in the next few days.  Return to the emergency department if symptoms significantly worsen or change.   Abdominal Pain, Adult Many things can cause abdominal pain. Usually, abdominal pain is not caused by a disease and will improve without treatment. It can often be observed and treated at home. Your health care provider will do a physical exam and possibly order blood tests and X-rays to help determine the seriousness of your pain. However, in many cases, more time must pass before a clear cause of the pain can be found. Before that point, your health care provider may not know if you need more testing or further treatment. HOME CARE INSTRUCTIONS Monitor your abdominal pain for any changes. The following actions may help to alleviate any discomfort you are experiencing:  Only take over-the-counter or prescription medicines as directed by your health care provider.  Do not take laxatives unless directed to do so by your health care provider.  Try a clear liquid diet (broth, tea, or water) as directed by your health care provider. Slowly move to a bland diet as tolerated. SEEK MEDICAL CARE IF:  You have unexplained abdominal pain.  You have abdominal pain associated with nausea or diarrhea.  You have pain when you urinate or have a bowel movement.  You experience abdominal pain that wakes you in the night.  You have abdominal pain that is worsened or improved by eating food.  You have abdominal pain that is worsened with eating fatty foods.  You have a fever. SEEK IMMEDIATE MEDICAL CARE IF:  Your pain does not go away within 2 hours.  You keep throwing up (vomiting).  Your pain is felt only in portions of the abdomen, such as the right side or the left lower portion of the abdomen.  You pass bloody or black tarry stools. MAKE SURE YOU:  Understand these instructions.  Will watch your  condition.  Will get help right away if you are not doing well or get worse.   This information is not intended to replace advice given to you by your health care provider. Make sure you discuss any questions you have with your health care provider.   Document Released: 11/10/2004 Document Revised: 10/22/2014 Document Reviewed: 10/10/2012 Elsevier Interactive Patient Education [employer].

## 2015-04-15 ENCOUNTER — Encounter: Payer: Self-pay | Admitting: Internal Medicine

## 2016-03-20 ENCOUNTER — Emergency Department (HOSPITAL_BASED_OUTPATIENT_CLINIC_OR_DEPARTMENT_OTHER)
Admission: EM | Admit: 2016-03-20 | Discharge: 2016-03-20 | Disposition: A | Discharge status: Home / Self Care | Payer: Managed Care, Other (non HMO) | Attending: Emergency Medicine | Admitting: Emergency Medicine

## 2016-03-20 ENCOUNTER — Emergency Department (HOSPITAL_BASED_OUTPATIENT_CLINIC_OR_DEPARTMENT_OTHER): Payer: Managed Care, Other (non HMO)

## 2016-03-20 ENCOUNTER — Encounter (HOSPITAL_BASED_OUTPATIENT_CLINIC_OR_DEPARTMENT_OTHER): Payer: Self-pay | Admitting: Respiratory Therapy

## 2016-03-20 DIAGNOSIS — I10 Essential (primary) hypertension: Secondary | ICD-10-CM | POA: Diagnosis not present

## 2016-03-20 DIAGNOSIS — J45909 Unspecified asthma, uncomplicated: Secondary | ICD-10-CM | POA: Insufficient documentation

## 2016-03-20 DIAGNOSIS — Z87891 Personal history of nicotine dependence: Secondary | ICD-10-CM | POA: Insufficient documentation

## 2016-03-20 DIAGNOSIS — E119 Type 2 diabetes mellitus without complications: Secondary | ICD-10-CM | POA: Insufficient documentation

## 2016-03-20 DIAGNOSIS — S8991XA Unspecified injury of right lower leg, initial encounter: Secondary | ICD-10-CM | POA: Diagnosis present

## 2016-03-20 DIAGNOSIS — Y999 Unspecified external cause status: Secondary | ICD-10-CM | POA: Diagnosis not present

## 2016-03-20 DIAGNOSIS — Z85828 Personal history of other malignant neoplasm of skin: Secondary | ICD-10-CM | POA: Insufficient documentation

## 2016-03-20 DIAGNOSIS — W108XXA Fall (on) (from) other stairs and steps, initial encounter: Secondary | ICD-10-CM | POA: Insufficient documentation

## 2016-03-20 DIAGNOSIS — S8391XA Sprain of unspecified site of right knee, initial encounter: Secondary | ICD-10-CM | POA: Insufficient documentation

## 2016-03-20 DIAGNOSIS — Y929 Unspecified place or not applicable: Secondary | ICD-10-CM | POA: Diagnosis not present

## 2016-03-20 DIAGNOSIS — Y939 Activity, unspecified: Secondary | ICD-10-CM | POA: Insufficient documentation

## 2016-03-20 MED ORDER — IBUPROFEN 800 MG PO TABS
800.0000 mg | ORAL_TABLET | Freq: Once | ORAL | Status: AC
  Administered 2016-03-20: 800 mg via ORAL
  Filled 2016-03-20: qty 1

## 2016-03-20 NOTE — ED Triage Notes (Signed)
Pt slipped and fell down 4 stairs, landing with right knee twisting underneath her.  Pt denies head injury, c/o right knee pain.

## 2016-03-20 NOTE — ED Provider Notes (Signed)
[facility] DEPT [facility] Provider Note   CSN: [id number] Arrival date & time: 03/20/16  [id number]  By signing my name below, I, Megan Petty, attest that this documentation has been prepared under the direction and in the presence of Megan Cater, PA-C. Electronically Signed: Gwenlyn Petty, ED Scribe. 03/20/16. 8:38 PM.   History   Chief Complaint Chief Complaint  Patient presents with  . Knee Pain  . Fall   The history is provided by the patient. No language interpreter was used.   HPI Comments: Megan Petty is a 56 y.o. female who presents to the Emergency Department complaining of constant, moderate right knee pain s/p fall down 4 stairs today. She states during the fall, her knee twisted underneath her. Pt denies head injury or LOC. Pain is exacerbated with full flexion and extension of the right knee. She reports slight joint swelling. Pt was able to ambulate following fall.   Past Medical History:  Diagnosis Date  . Allergy   . Asthma    seasonal  . Cancer ([place])    melanoma- left leg  . Colon polyps   . Diabetes mellitus without complication (HCC)    hx, not any longer  . Family history of breast cancer   . Family history of colon cancer   . Family history of ovarian cancer   . Family history of pancreatic cancer   . GERD (gastroesophageal reflux disease)   . H/O hiatal hernia    had surgery to fix with last surgery  . Hypertension     Patient Active Problem List   Diagnosis Date Noted  . Genetic testing 08/08/2014  . Family history of breast cancer   . Family history of ovarian cancer   . Family history of pancreatic cancer   . Family history of colon cancer   . Colon polyps     Past Surgical History:  Procedure Laterality Date  . ABDOMINAL HYSTERECTOMY    . ABDOMINAL SURGERY    . ABDOMINOPLASTY/PANNICULECTOMY WITH LIPOSUCTION N/A 12/31/2012   Procedure: PANNICULECTOMY WITH LIPOSUCTION;  Surgeon: Cristine Polio, MD;  Location: [facility];  Service: Plastics;   Laterality: N/A;  . CESAREAN SECTION    . COLONOSCOPY    . gastric bypass sleeve     repaired 2 hernias at that time  . HERNIA REPAIR    . LITHOTRIPSY    . MELANOMA EXCISION    . PARTIAL HYSTERECTOMY    . TONSILLECTOMY      OB History    No data available       Home Medications    Prior to Admission medications   Not on File    Family History Family History  Problem Relation Age of Onset  . Colon cancer Mother     gallbladder cancer spread to colon  . Cancer Mother 40    gallbladder  . Lung cancer Father 66  . Pancreatic cancer Brother 73    Maternal 1/2 brother  . Breast cancer Paternal Aunt 26  . Kidney cancer Maternal Grandmother   . Lung cancer Maternal Grandfather   . Breast cancer Paternal Grandmother     dx in her 59s  . Ovarian cancer Paternal Grandmother     dx in her 24s  . Esophageal cancer Neg Hx   . Rectal cancer Neg Hx   . Stomach cancer Neg Hx     Social History Social History  Substance Use Topics  . Smoking status: Former Smoker    Quit date: 02/14/2009  .  Smokeless tobacco: Never Used  . Alcohol use No     Allergies   Vicodin [hydrocodone-acetaminophen]   Review of Systems Review of Systems  Constitutional: Negative for activity change.  HENT:       No head injury  Musculoskeletal: Positive for arthralgias and joint swelling. Negative for back pain, gait problem and neck pain.  Skin: Negative for wound.  Neurological: Negative for syncope, weakness and numbness.   Physical Exam Updated Vital Signs BP 114/77 (BP Location: Right Arm)   Pulse 67   Temp 98.3 F (36.8 C) (Oral)   Resp 19   Ht 5\' 7"  (1.702 m)   Wt 218 lb (98.9 kg)   SpO2 100%   BMI 34.14 kg/m   Physical Exam  Constitutional: She appears well-developed and well-nourished.  HENT:  Head: Normocephalic and atraumatic.  Eyes: Pupils are equal, round, and reactive to light.  Neck: Normal range of motion. Neck supple.  Cardiovascular: Normal pulses.  Exam  reveals no decreased pulses.   Musculoskeletal: She exhibits tenderness. She exhibits no edema.       Right hip: Normal.       Right knee: She exhibits decreased range of motion, swelling and effusion. Tenderness found. Lateral joint line tenderness noted. No medial joint line tenderness noted.       Right ankle: Normal.       Right upper leg: Normal.       Right lower leg: Normal.  Neurological: She is alert. No sensory deficit.  Motor, sensation, and vascular distal to the injury is fully intact.   Skin: Skin is warm and dry.  Psychiatric: She has a normal mood and affect.  Nursing note and vitals reviewed.    ED Treatments / Results  DIAGNOSTIC STUDIES: Oxygen Saturation is 100% on RA, normal by my interpretation.    COORDINATION OF CARE: 8:36 PM Discussed treatment plan with pt at bedside which includes knee sleeve application and follow-up with sports medicine and pt agreed to plan. Discussed return precautions with patient.  Radiology Dg Knee Complete 4 Views Right  Result Date: 03/20/2016 CLINICAL DATA:  Fall down stairs at home, twisted and popped right knee EXAM: RIGHT KNEE - COMPLETE 4+ VIEW COMPARISON:  None. FINDINGS: No fracture or dislocation is visualized. There is a small suprapatellar effusion. There is mild patellofemoral narrowing. Mild to moderate narrowing of the medial joint space compartment and mild narrowing of the lateral joint space compartment with bony spurring evident. IMPRESSION: 1. No acute osseous abnormality 2. Mild to moderate degenerative changes of the right knee. Small suprapatellar joint effusion. Electronically Signed   By: Donavan Foil M.D.   On: 03/20/2016 19:00    Procedures Procedures (including critical care time)  Medications Ordered in ED Medications  ibuprofen (ADVIL,MOTRIN) tablet 800 mg (800 mg Oral Given 03/20/16 1844)     Initial Impression / Assessment and Plan / ED Course  I have reviewed the triage vital signs and the nursing  notes.  Pertinent labs & imaging results that were available during my care of the patient were reviewed by me and considered in my medical decision making (see chart for details).     Vital signs reviewed and are as follows: Vitals:   03/20/16 2000 03/20/16 2108  BP: 114/77 102/78  Pulse: 67 66  Resp: 19 21  Temp: 98.3 F (36.8 C) 98.4 F (36.9 C)   Patient provided with knee sleeve. Encouraged sports medicine follow-up. She does not wish to use crutches and  does not like knee immobilizer. She requests work note for tomorrow but states that she has to be up on her feet for the rest of the week. Discussed rice protocol and NSAID use. Discussed that she needs to follow-up with sports medicine if she has any persistent pain, swelling, instability.    I personally performed the services described in this documentation, which was scribed in my presence. The recorded information has been reviewed and is accurate.   Final Clinical Impressions(s) / ED Diagnoses   Final diagnoses:  Sprain of right knee, unspecified ligament, initial encounter   Patient with contusion and right knee effusion after a slip and fall tonight. This was a twisting injury. X-rays negative. Patient ambulated into the emergency department. Suspect ligamentous injury. Appropriate follow-up given and patient counseled on supportive symptoms as above. Strongly encouraged rest.  New Prescriptions There are no discharge medications for this patient.    Megan Cater, PA-C 03/20/16 [place], MD 03/20/16 [phone]

## 2016-03-20 NOTE — Discharge Instructions (Signed)
Please read and follow all provided instructions.  Your diagnoses today include:  1. Sprain of right knee, unspecified ligament, initial encounter     Tests performed today include:  An x-ray of the affected area - does NOT show any broken bones  Vital signs. See below for your results today.   Medications prescribed:   None  Take any prescribed medications only as directed.  Home care instructions:   Follow any educational materials contained in this packet  Follow R.I.C.E. Protocol:  R - rest your injury   I  - use ice on injury without applying directly to skin  C - compress injury with bandage or splint  E - elevate the injury as much as possible  Follow-up instructions: Please follow-up with your primary care provider or the provided orthopedic physician (bone specialist) if you continue to have significant pain in 1 week. In this case you may have a more severe injury that requires further care.   Return instructions:   Please return if your toes or feet are numb or tingling, appear gray or blue, or you have severe pain (also elevate the leg and loosen splint or wrap if you were given one)  Please return to the Emergency Department if you experience worsening symptoms.   Please return if you have any other emergent concerns.  Additional Information:  Your vital signs today were: BP 114/77 (BP Location: Right Arm)    Pulse 67    Temp 98.3 F (36.8 C) (Oral)    Resp 19    Ht 5\' 7"  (1.702 m)    Wt 98.9 kg    SpO2 100%    BMI 34.14 kg/m  If your blood pressure (BP) was elevated above 135/85 this visit, please have this repeated by your doctor within one month. --------------

## 2017-06-09 ENCOUNTER — Ambulatory Visit (INDEPENDENT_AMBULATORY_CARE_PROVIDER_SITE_OTHER): Payer: 59 | Admitting: Family Medicine

## 2017-06-09 ENCOUNTER — Encounter: Payer: Self-pay | Admitting: Family Medicine

## 2017-06-09 VITALS — BP 126/76 | HR 83 | Temp 98.7°F | Ht 68.0 in | Wt 212.4 lb

## 2017-06-09 DIAGNOSIS — R635 Abnormal weight gain: Secondary | ICD-10-CM

## 2017-06-09 DIAGNOSIS — E669 Obesity, unspecified: Secondary | ICD-10-CM | POA: Diagnosis not present

## 2017-06-09 DIAGNOSIS — Z114 Encounter for screening for human immunodeficiency virus [HIV]: Secondary | ICD-10-CM | POA: Diagnosis not present

## 2017-06-09 DIAGNOSIS — Z9189 Other specified personal risk factors, not elsewhere classified: Secondary | ICD-10-CM

## 2017-06-09 DIAGNOSIS — E559 Vitamin D deficiency, unspecified: Secondary | ICD-10-CM

## 2017-06-09 DIAGNOSIS — R5383 Other fatigue: Secondary | ICD-10-CM

## 2017-06-09 DIAGNOSIS — Z9884 Bariatric surgery status: Secondary | ICD-10-CM | POA: Diagnosis not present

## 2017-06-09 DIAGNOSIS — Z1159 Encounter for screening for other viral diseases: Secondary | ICD-10-CM

## 2017-06-09 NOTE — Progress Notes (Deleted)
Megan Petty is a 57 y.o. female is here to [facility].   Patient Care Team: Briscoe Deutscher, DO as PCP - General (Family Medicine)   History of Present Illness:   HPI: See Assessment and Plan section for Problem Based Charting of issues discussed today.  Health Maintenance Due  Topic Date Due  . TETANUS/TDAP  01/31/1981  . PAP SMEAR  02/01/1983  . MAMMOGRAM  02/01/2012  . COLONOSCOPY  04/05/2015   Depression screen PHQ 2/9 06/09/2017  Decreased Interest 0  Down, Depressed, Hopeless 0  PHQ - 2 Score 0  Altered sleeping 0  Tired, decreased energy 0  Change in appetite 0  Feeling bad or failure about yourself  0  Trouble concentrating 0  Moving slowly or fidgety/restless 0  Suicidal thoughts 0  PHQ-9 Score 0  Difficult doing work/chores Not difficult at all   PMHx, SurgHx, SocialHx, Medications, and Allergies were reviewed in the Visit Navigator and updated as appropriate.   Past Medical History:  Diagnosis Date  . Allergy   . Asthma    seasonal  . Cancer ([place])    melanoma- left leg  . Colon polyps   . Diabetes mellitus without complication (HCC)    hx, not any longer  . Family history of breast cancer   . Family history of colon cancer   . Family history of ovarian cancer   . Family history of pancreatic cancer   . GERD (gastroesophageal reflux disease)   . H/O hiatal hernia    had surgery to fix with last surgery  . Hypertension     Past Surgical History:  Procedure Laterality Date  . ABDOMINAL HYSTERECTOMY    . ABDOMINAL SURGERY    . ABDOMINOPLASTY/PANNICULECTOMY WITH LIPOSUCTION N/A 12/31/2012   Procedure: PANNICULECTOMY WITH LIPOSUCTION;  Surgeon: Cristine Polio, MD;  Location: [place] [facility];  Service: Plastics;  Laterality: N/A;  . CESAREAN SECTION    . COLONOSCOPY    . gastric bypass sleeve     repaired 2 hernias at that time  . HERNIA REPAIR    . LITHOTRIPSY    . MELANOMA EXCISION    . PARTIAL HYSTERECTOMY    . TONSILLECTOMY      Family  History  Problem Relation Age of Onset  . Colon cancer Mother        gallbladder cancer spread to colon  . Cancer Mother 42       gallbladder  . Lung cancer Father 12  . Pancreatic cancer Brother 57       Maternal 1/2 brother  . Breast cancer Paternal Aunt 76  . Kidney cancer Maternal Grandmother   . Lung cancer Maternal Grandfather   . Breast cancer Paternal Grandmother        dx in her 69s  . Ovarian cancer Paternal Grandmother        dx in her 57s  . Esophageal cancer Neg Hx   . Rectal cancer Neg Hx   . Stomach cancer Neg Hx    Social History   Tobacco Use  . Smoking status: Former Smoker    Last attempt to quit: 02/14/2009    Years since quitting: 8.3  . Smokeless tobacco: Never Used  Substance Use Topics  . Alcohol use: No  . Drug use: No    Current Medications and Allergies:  No current outpatient medications on file.   Allergies  Allergen Reactions  . Vicodin [Hydrocodone-Acetaminophen] Itching   Review of Systems:   Pertinent items are  noted in the HPI. Otherwise, ROS is negative.  Vitals:   Vitals:   06/09/17 1359  BP: 126/76  Pulse: 83  Temp: 98.7 F (37.1 C)  TempSrc: Oral  SpO2: 96%  Weight: 212 lb 6.4 oz (96.3 kg)  Height: 5\' 8"  (1.727 m)     Body mass index is 32.3 kg/m.  Physical Exam:   Physical Exam  Results for orders placed or performed in visit on 06/09/17  HIV antibody  Result Value Ref Range   HIV 1&2 Ab, 4th Generation NON-REACTIVE NON-REACTI  Hepatitis C antibody  Result Value Ref Range   Hepatitis C Ab NON-REACTIVE NON-REACTI   SIGNAL TO CUT-OFF 0.02 <1.00  CBC with Differential/Platelet  Result Value Ref Range   WBC 4.9 3.8 - 10.8 Thousand/uL   RBC 4.73 3.80 - 5.10 Million/uL   Hemoglobin 14.0 11.7 - 15.5 g/dL   HCT 40.9 35.0 - 45.0 %   MCV 86.5 80.0 - 100.0 fL   MCH 29.6 27.0 - 33.0 pg   MCHC 34.2 32.0 - 36.0 g/dL   RDW 12.5 11.0 - 15.0 %   Platelets 217 140 - 400 Thousand/uL   MPV 10.6 7.5 - 12.5 fL   Neutro  Abs 2,744 1,500 - 7,800 cells/uL   Lymphs Abs 1,553 850 - 3,900 cells/uL   WBC mixed population 446 200 - 950 cells/uL   Eosinophils Absolute 118 15 - 500 cells/uL   Basophils Absolute 39 0 - 200 cells/uL   Neutrophils Relative % 56 %   Total Lymphocyte 31.7 %   Monocytes Relative 9.1 %   Eosinophils Relative 2.4 %   Basophils Relative 0.8 %  Comprehensive metabolic panel  Result Value Ref Range   Glucose, Bld 88 65 - 99 mg/dL   BUN 22 7 - 25 mg/dL   Creat 0.89 0.50 - 1.05 mg/dL   BUN/Creatinine Ratio NOT APPLICABLE 6 - 22 (calc)   Sodium 144 135 - 146 mmol/L   Potassium 4.4 3.5 - 5.3 mmol/L   Chloride 106 98 - 110 mmol/L   CO2 24 20 - 32 mmol/L   Calcium 9.5 8.6 - 10.4 mg/dL   Total Protein 6.5 6.1 - 8.1 g/dL   Albumin 4.0 3.6 - 5.1 g/dL   Globulin 2.5 1.9 - 3.7 g/dL (calc)   AG Ratio 1.6 1.0 - 2.5 (calc)   Total Bilirubin 0.5 0.2 - 1.2 mg/dL   Alkaline phosphatase (APISO) 72 33 - 130 U/L   AST 15 10 - 35 U/L   ALT 11 6 - 29 U/L  Lipid panel  Result Value Ref Range   Cholesterol 179 <200 mg/dL   HDL 71 >50 mg/dL   Triglycerides 88 <150 mg/dL   LDL Cholesterol (Calc) 90 mg/dL (calc)   Total CHOL/HDL Ratio 2.5 <5.0 (calc)   Non-HDL Cholesterol (Calc) 108 <130 mg/dL (calc)  TSH  Result Value Ref Range   TSH 1.08 mIU/L  Vitamin B12  Result Value Ref Range   Vitamin B-12 409 200 - 1,100 pg/mL  VITAMIN D 25 Hydroxy (Vit-D Deficiency, Fractures)  Result Value Ref Range   Vit D, 25-Hydroxy 23 (L) 30 - 100 ng/mL  Hemoglobin A1c  Result Value Ref Range   Hgb A1c MFr Bld 4.9 <5.7 % of total Hgb   Mean Plasma Glucose 94 (calc)   eAG (mmol/L) 5.2 (calc)  Iron, TIBC and Ferritin Panel  Result Value Ref Range   Iron 105 45 - 160 mcg/dL   TIBC 311 250 -  450 mcg/dL (calc)   %SAT 34 11 - 50 % (calc)   Ferritin 118 10 - 232 ng/mL    Assessment and Plan:   ***

## 2017-06-10 LAB — LIPID PANEL
Cholesterol: 179 mg/dL (ref <200)
HDL: 71 mg/dL (ref >50)
LDL Cholesterol (Calc): 90 mg/dL (calc)
Non-HDL Cholesterol (Calc): 108 mg/dL (calc) (ref <130)
Total CHOL/HDL Ratio: 2.5 (calc) (ref <5.0)
Triglycerides: 88 mg/dL (ref <150)

## 2017-06-10 LAB — VITAMIN B12: Vitamin B-12: 409 pg/mL (ref 200–1100)

## 2017-06-10 LAB — IRON,TIBC AND FERRITIN PANEL
%SAT: 34 % (calc) (ref 11–50)
Ferritin: 118 ng/mL (ref 10–232)
Iron: 105 ug/dL (ref 45–160)
TIBC: 311 mcg/dL (calc) (ref 250–450)

## 2017-06-10 LAB — CBC WITH DIFFERENTIAL/PLATELET
Basophils Absolute: 39 cells/uL (ref 0–200)
Basophils Relative: 0.8 %
Eosinophils Absolute: 118 cells/uL (ref 15–500)
Eosinophils Relative: 2.4 %
HCT: 40.9 % (ref 35.0–45.0)
Hemoglobin: 14 g/dL (ref 11.7–15.5)
Lymphs Abs: 1553 cells/uL (ref 850–3900)
MCH: 29.6 pg (ref 27.0–33.0)
MCHC: 34.2 g/dL (ref 32.0–36.0)
MCV: 86.5 fL (ref 80.0–100.0)
MPV: 10.6 fL (ref 7.5–12.5)
Monocytes Relative: 9.1 %
Neutro Abs: 2744 cells/uL (ref 1500–7800)
Neutrophils Relative %: 56 %
Platelets: 217 10*3/uL (ref 140–400)
RBC: 4.73 10*6/uL (ref 3.80–5.10)
RDW: 12.5 % (ref 11.0–15.0)
Total Lymphocyte: 31.7 %
WBC mixed population: 446 cells/uL (ref 200–950)
WBC: 4.9 10*3/uL (ref 3.8–10.8)

## 2017-06-10 LAB — COMPREHENSIVE METABOLIC PANEL
AG Ratio: 1.6 (calc) (ref 1.0–2.5)
ALT: 11 U/L (ref 6–29)
AST: 15 U/L (ref 10–35)
Albumin: 4 g/dL (ref 3.6–5.1)
Alkaline phosphatase (APISO): 72 U/L (ref 33–130)
BUN: 22 mg/dL (ref 7–25)
CO2: 24 mmol/L (ref 20–32)
Calcium: 9.5 mg/dL (ref 8.6–10.4)
Chloride: 106 mmol/L (ref 98–110)
Creat: 0.89 mg/dL (ref 0.50–1.05)
Globulin: 2.5 g/dL (calc) (ref 1.9–3.7)
Glucose, Bld: 88 mg/dL (ref 65–99)
Potassium: 4.4 mmol/L (ref 3.5–5.3)
Sodium: 144 mmol/L (ref 135–146)
Total Bilirubin: 0.5 mg/dL (ref 0.2–1.2)
Total Protein: 6.5 g/dL (ref 6.1–8.1)

## 2017-06-10 LAB — TSH: TSH: 1.08 mIU/L

## 2017-06-10 LAB — VITAMIN D 25 HYDROXY (VIT D DEFICIENCY, FRACTURES): Vit D, 25-Hydroxy: 23 ng/mL — ABNORMAL LOW (ref 30–100)

## 2017-06-10 LAB — HEMOGLOBIN A1C
Hgb A1c MFr Bld: 4.9 % of total Hgb (ref <5.7)
Mean Plasma Glucose: 94 (calc)
eAG (mmol/L): 5.2 (calc)

## 2017-06-10 LAB — HIV ANTIBODY (ROUTINE TESTING W REFLEX): HIV 1&2 Ab, 4th Generation: NONREACTIVE

## 2017-06-11 LAB — HEPATITIS C ANTIBODY
Hepatitis C Ab: NONREACTIVE
SIGNAL TO CUT-OFF: 0.02 (ref <1.00)

## 2017-06-12 LAB — PTH, INTACT AND CALCIUM
Calcium: 9.6 mg/dL (ref 8.6–10.4)
PTH: 36 pg/mL (ref 14–64)

## 2017-06-12 NOTE — Progress Notes (Signed)
Megan Petty is a 57 y.o. female is here to establish care.  History of Present Illness:   HPI:  Strong family history of cancer.  Status post genetic testing and was told "she was the lucky one."  History of low vitamin D but does not tolerate supplementation.  Previous physicians were at [facility] and [facility].  Health Maintenance Due  Topic Date Due  . TETANUS/TDAP  01/31/1981  . PAP SMEAR  02/01/1983  . MAMMOGRAM  02/01/2012  . COLONOSCOPY  04/05/2015   Depression screen PHQ 2/9 06/09/2017  Decreased Interest 0  Down, Depressed, Hopeless 0  PHQ - 2 Score 0  Altered sleeping 0  Tired, decreased energy 0  Change in appetite 0  Feeling bad or failure about yourself  0  Trouble concentrating 0  Moving slowly or fidgety/restless 0  Suicidal thoughts 0  PHQ-9 Score 0  Difficult doing work/chores Not difficult at all   PMHx, SurgHx, SocialHx, FamHx, Medications, and Allergies were reviewed in the Visit Navigator and updated as appropriate.   Patient Active Problem List   Diagnosis Date Noted  . Kidney stones 09/08/2014  . Genetic testing 08/08/2014  . Family history of breast cancer   . Family history of ovarian cancer   . Family history of pancreatic cancer   . Family history of colon cancer   . Colon polyps   . History of laparoscopic partial gastrectomy 02/17/2011  . Morbid obesity ([place]) 01/19/2011   Social History   Tobacco Use  . Smoking status: Former Smoker    Last attempt to quit: 02/14/2009    Years since quitting: 8.3  . Smokeless tobacco: Never Used  Substance Use Topics  . Alcohol use: No  . Drug use: No   Current Medications and Allergies:   No current outpatient medications on file.  Allergies  Allergen Reactions  . Vicodin [Hydrocodone-Acetaminophen] Itching   Review of Systems   Pertinent items are noted in the HPI. Otherwise, ROS is negative.  Vitals:   Vitals:   06/09/17 1359  BP: 126/76  Pulse: 83  Temp: 98.7 F  (37.1 C)  TempSrc: Oral  SpO2: 96%  Weight: 212 lb 6.4 oz (96.3 kg)  Height: 5\' 8"  (1.727 m)     Body mass index is 32.3 kg/m.   Physical Exam:   Physical Exam  Constitutional: She is oriented to person, place, and time. She appears well-developed and well-nourished. No distress.  HENT:  Head: Normocephalic and atraumatic.  Right Ear: External ear normal.  Left Ear: External ear normal.  Nose: Nose normal.  Mouth/Throat: Oropharynx is clear and moist.  Eyes: Pupils are equal, round, and reactive to light. Conjunctivae and EOM are normal.  Neck: Normal range of motion. Neck supple. No thyromegaly present.  Cardiovascular: Normal rate, regular rhythm, normal heart sounds and intact distal pulses.  Pulmonary/Chest: Effort normal and breath sounds normal.  Abdominal: Soft. Bowel sounds are normal.  Musculoskeletal: Normal range of motion.  Lymphadenopathy:    She has no cervical adenopathy.  Neurological: She is alert and oriented to person, place, and time.  Skin: Skin is warm and dry. Capillary refill takes less than 2 seconds.  Psychiatric: She has a normal mood and affect. Her behavior is normal.  Nursing note and vitals reviewed.   Assessment and Plan:   Megan Petty was seen today for establish care.  Diagnoses and all orders for this visit:  Weight gain -     CBC with Differential/Platelet -  Comprehensive metabolic panel -     Lipid panel -     TSH -     Hemoglobin A1c  Obesity (BMI 30-39.9) -     Lipid panel -     Hemoglobin A1c  History of bariatric surgery -     CBC with Differential/Platelet -     Comprehensive metabolic panel -     Vitamin B12 -     PTH, Intact and Calcium -     Hemoglobin A1c  Screening for HIV (human immunodeficiency virus) -     HIV antibody  Encounter for hepatitis C virus screening test for high risk patient -     Hepatitis C antibody  Vitamin D deficiency -     VITAMIN D 25 Hydroxy (Vit-D Deficiency, Fractures) -     PTH,  Intact and Calcium  Fatigue, unspecified type -     CBC with Differential/Platelet -     Comprehensive metabolic panel -     Lipid panel -     TSH -     Vitamin B12 -     VITAMIN D 25 Hydroxy (Vit-D Deficiency, Fractures) -     PTH, Intact and Calcium -     Hemoglobin A1c -     Iron, TIBC and Ferritin Panel   . Reviewed expectations re: course of current medical issues. . Discussed self-management of symptoms. . Outlined signs and symptoms indicating need for more acute intervention. . Patient verbalized understanding and all questions were answered. Marland Kitchen Health Maintenance issues including appropriate healthy diet, exercise, and smoking avoidance were discussed with patient. . See orders for this visit as documented in the electronic medical record. . Patient received an After Visit Summary.  Briscoe Deutscher, DO [place], [place] 06/13/2017  Future Appointments  Date Time Provider [place]  12/11/2017  7:00 AM Briscoe Deutscher, DO [facility]   Records requested if needed. Time spent with the patient: 30 minutes, of which >50% was spent in obtaining information about her symptoms, reviewing her previous labs, evaluations, and treatments, counseling her about her condition (please see the discussed topics above), and developing a plan to further investigate it; she had a number of questions which I addressed.

## 2017-06-13 ENCOUNTER — Encounter: Payer: Self-pay | Admitting: Family Medicine

## 2017-06-15 ENCOUNTER — Encounter: Payer: Self-pay | Admitting: Family Medicine

## 2017-06-28 NOTE — Telephone Encounter (Signed)
Dr. Juleen China, please advise if okay for pt to take magnesium and potassium for leg cramps?

## 2017-08-20 ENCOUNTER — Encounter: Payer: Self-pay | Admitting: Family Medicine

## 2017-10-03 ENCOUNTER — Encounter: Payer: Self-pay | Admitting: Physician Assistant

## 2017-10-03 ENCOUNTER — Ambulatory Visit (INDEPENDENT_AMBULATORY_CARE_PROVIDER_SITE_OTHER): Payer: 59 | Admitting: Physician Assistant

## 2017-10-03 VITALS — BP 110/70 | HR 77 | Temp 98.0°F | Ht 68.0 in | Wt 206.4 lb

## 2017-10-03 DIAGNOSIS — R1011 Right upper quadrant pain: Secondary | ICD-10-CM | POA: Diagnosis not present

## 2017-10-03 DIAGNOSIS — R300 Dysuria: Secondary | ICD-10-CM

## 2017-10-03 LAB — POCT URINALYSIS DIP (MANUAL ENTRY)
Bilirubin, UA: NEGATIVE
Blood, UA: NEGATIVE
Glucose, UA: NEGATIVE mg/dL
Ketones, POC UA: NEGATIVE mg/dL
NITRITE UA: NEGATIVE
PH UA: 5.5 (ref 5.0–8.0)
PROTEIN UA: NEGATIVE mg/dL
Spec Grav, UA: >=1.03 — AB (ref 1.010–1.025)
Urobilinogen, UA: 0.2 E.U./dL

## 2017-10-03 NOTE — Patient Instructions (Signed)
It was great to see you!  We will contact you regarding your ultrasound and labs. If your pain worsens or fever develops overnight, please go to the ER.  Take care,  Inda Coke PA-C   Abdominal Pain, Adult Many things can cause belly (abdominal) pain. Most times, belly pain is not dangerous. Many cases of belly pain can be watched and treated at home. Sometimes belly pain is serious, though. Your doctor will try to find the cause of your belly pain. Follow these instructions at home:  Take over-the-counter and prescription medicines only as told by your doctor. Do not take medicines that help you poop (laxatives) unless told to by your doctor.  Drink enough fluid to keep your pee (urine) clear or pale yellow.  Watch your belly pain for any changes.  Keep all follow-up visits as told by your doctor. This is important. Contact a doctor if:  Your belly pain changes or gets worse.  You are not hungry, or you lose weight without trying.  You are having trouble pooping (constipated) or have watery poop (diarrhea) for more than 2-3 days.  You have pain when you pee or poop.  Your belly pain wakes you up at night.  Your pain gets worse with meals, after eating, or with certain foods.  You are throwing up and cannot keep anything down.  You have a fever. Get help right away if:  Your pain does not go away as soon as your doctor says it should.  You cannot stop throwing up.  Your pain is only in areas of your belly, such as the right side or the left lower part of the belly.  You have bloody or black poop, or poop that looks like tar.  You have very bad pain, cramping, or bloating in your belly.  You have signs of not having enough fluid or water in your body (dehydration), such as: ? Dark pee, very little pee, or no pee. ? Cracked lips. ? Dry mouth. ? Sunken eyes. ? Sleepiness. ? Weakness. This information is not intended to replace advice given to you by your  health care provider. Make sure you discuss any questions you have with your health care provider. Document Released: 07/20/2007 Document Revised: 08/21/2015 Document Reviewed: 07/15/2015 Elsevier Interactive Patient Education  2018 [employer].

## 2017-10-03 NOTE — Progress Notes (Signed)
Megan Petty is a 57 y.o. female here for a new problem.   History of Present Illness:   Chief Complaint  Patient presents with  . Abdominal Pain    Gallbladder    HPI   Megan Petty has a significant PMH of GERD, HTN, kidney stones and s/p gastric sleeve procedure.   She states that over the past several months, she has noted that almost anything that she eats or drinks causes belching. Over the weekend she developed RUQ pain, felt like a sharp shooting pain, was about 70min to an hour after dinner, lasted for no more than 1 hour. The pain was so bad, she thought that she was maybe going to have to go to the ER. It happened again last night, at the same time of day and around the same time in relation to food. The first time it was after eating hamburger, the second time with grilled chicken salad with ranch dressing. She has had multiple kidney stones in the past but does not feel like that is what this is.  She has not tried anything for her symptoms. She does not drink any ETOH.   She did have CT abd pelvis with contrast in February 2017 for LLQ pain which showed cholelithiasis. Her mother had a history of gallbladder cancer that spread to her colon.   Past Medical History:  Diagnosis Date  . Allergy   . Asthma    seasonal  . Cancer ([place])    melanoma- left leg  . Colon polyps   . Diabetes mellitus without complication (HCC)    hx, not any longer  . Family history of breast cancer   . Family history of colon cancer   . Family history of ovarian cancer   . Family history of pancreatic cancer   . GERD (gastroesophageal reflux disease)   . H/O hiatal hernia    had surgery to fix with last surgery  . Hypertension      Social History   Socioeconomic History  . Marital status: Married    Spouse name: Not on file  . Number of children: 2  . Years of education: Not on file  . Highest education level: Not on file  Occupational History  . Not on file  Social Needs  .  Financial resource strain: Not on file  . Food insecurity:    Worry: Not on file    Inability: Not on file  . Transportation needs:    Medical: Not on file    Non-medical: Not on file  Tobacco Use  . Smoking status: Former Smoker    Last attempt to quit: 02/14/2009    Years since quitting: 8.6  . Smokeless tobacco: Never Used  Substance and Sexual Activity  . Alcohol use: No  . Drug use: No  . Sexual activity: Not on file  Lifestyle  . Physical activity:    Days per week: Not on file    Minutes per session: Not on file  . Stress: Not on file  Relationships  . Social connections:    Talks on phone: Not on file    Gets together: Not on file    Attends religious service: Not on file    Active member of club or organization: Not on file    Attends meetings of clubs or organizations: Not on file    Relationship status: Not on file  . Intimate partner violence:    Fear of current or ex partner: Not on  file    Emotionally abused: Not on file    Physically abused: Not on file    Forced sexual activity: Not on file  Other Topics Concern  . Not on file  Social History Narrative  . Not on file    Past Surgical History:  Procedure Laterality Date  . ABDOMINAL HYSTERECTOMY    . ABDOMINOPLASTY/PANNICULECTOMY WITH LIPOSUCTION N/A 12/31/2012   Procedure: PANNICULECTOMY WITH LIPOSUCTION;  Surgeon: Cristine Polio, MD;  Location: [facility];  Service: Plastics;  Laterality: N/A;  . CESAREAN SECTION    . COLONOSCOPY    . HERNIA REPAIR    . LAPAROSCOPIC GASTRIC SLEEVE RESECTION    . LITHOTRIPSY    . MELANOMA EXCISION    . TONSILLECTOMY      Family History  Problem Relation Age of Onset  . Colon cancer Mother        gallbladder cancer spread to colon  . Cancer Mother 40       gallbladder  . Lung cancer Father 59  . Pancreatic cancer Brother 29       Maternal 1/2 brother  . Breast cancer Paternal Aunt 28  . Kidney cancer Maternal Grandmother   . Lung cancer Maternal Grandfather    . Breast cancer Paternal Grandmother        dx in her 58s  . Ovarian cancer Paternal Grandmother        dx in her 33s  . Esophageal cancer Neg Hx   . Rectal cancer Neg Hx   . Stomach cancer Neg Hx     Allergies  Allergen Reactions  . Vicodin [Hydrocodone-Acetaminophen] Itching    Current Medications:  No current outpatient medications on file.   Review of Systems:   ROS  Vitals:   Vitals:   10/03/17 1552  BP: 110/70  Pulse: 77  Temp: 98 F (36.7 C)  TempSrc: Oral  SpO2: 98%  Weight: 206 lb 6.4 oz (93.6 kg)  Height: 5\' 8"  (1.727 m)     Body mass index is 31.38 kg/m.  Physical Exam:   Physical Exam  Constitutional: She appears well-developed. She is cooperative.  Non-toxic appearance. She does not have a sickly appearance. She does not appear ill. No distress.  Cardiovascular: Normal rate, regular rhythm, S1 normal, S2 normal, normal heart sounds and normal pulses.  No LE edema  Pulmonary/Chest: Effort normal and breath sounds normal.  Abdominal: Normal appearance and bowel sounds are normal. There is no tenderness. There is no rigidity, no rebound, no guarding, no CVA tenderness, no tenderness at McBurney's point and negative Murphy's sign.  Neurological: She is alert. GCS eye subscore is 4. GCS verbal subscore is 5. GCS motor subscore is 6.  Skin: Skin is warm, dry and intact.  Psychiatric: She has a normal mood and affect. Her speech is normal and behavior is normal.  Nursing note and vitals reviewed.  Results for orders placed or performed in visit on 10/03/17  POCT urinalysis dipstick  Result Value Ref Range   Color, UA yellow yellow   Clarity, UA clear clear   Glucose, UA negative negative mg/dL   Bilirubin, UA negative negative   Ketones, POC UA negative negative mg/dL   Spec Grav, UA >=1.030 (A) 1.010 - 1.025   Blood, UA negative negative   pH, UA 5.5 5.0 - 8.0   Protein Ur, POC negative negative mg/dL   Urobilinogen, UA 0.2 0.2 or 1.0 E.U./dL    Nitrite, UA Negative Negative   Leukocytes, UA Small (  1+) (A) Negative    Assessment and Plan:    Megan Petty was seen today for abdominal pain.  Diagnoses and all orders for this visit:  RUQ abdominal pain -     POCT urinalysis dipstick -     Urine Culture -     CBC with Differential/Platelet -     Comprehensive metabolic panel -     Lipase -     US Abdomen Complete; Future   No red flags on exam. Vitals stable. Suspect possible biliary colic. Will order abdominal u/s and labs. Abd u/s scheduled for 10:30 am 10/04/17. Discussed with patient that should her symptoms worsen overnight, needs to go to the ER. Patient verbalized understanding.  . Reviewed expectations re: course of current medical issues. . Discussed self-management of symptoms. . Outlined signs and symptoms indicating need for more acute intervention. . Patient verbalized understanding and all questions were answered. . See orders for this visit as documented in the electronic medical record. . Patient received an After-Visit Summary.   Inda Coke, PA-C

## 2017-10-04 ENCOUNTER — Encounter: Payer: Self-pay | Admitting: Physician Assistant

## 2017-10-04 ENCOUNTER — Other Ambulatory Visit: Payer: 59

## 2017-10-04 ENCOUNTER — Other Ambulatory Visit: Payer: Self-pay | Admitting: Surgical

## 2017-10-04 ENCOUNTER — Ambulatory Visit (HOSPITAL_COMMUNITY): Payer: 59

## 2017-10-04 ENCOUNTER — Ambulatory Visit (HOSPITAL_COMMUNITY)
Admission: RE | Admit: 2017-10-04 | Discharge: 2017-10-04 | Disposition: A | Discharge status: Home / Self Care | Payer: 59 | Source: Ambulatory Visit | Attending: Physician Assistant | Admitting: Physician Assistant

## 2017-10-04 DIAGNOSIS — R1011 Right upper quadrant pain: Secondary | ICD-10-CM

## 2017-10-04 DIAGNOSIS — K8020 Calculus of gallbladder without cholecystitis without obstruction: Secondary | ICD-10-CM | POA: Diagnosis not present

## 2017-10-04 LAB — CBC WITH DIFFERENTIAL/PLATELET
BASOS ABS: 0 10*3/uL (ref 0.0–0.1)
BASOS PCT: 0.7 % (ref 0.0–3.0)
EOS ABS: 0.1 10*3/uL (ref 0.0–0.7)
Eosinophils Relative: 1.1 % (ref 0.0–5.0)
HEMATOCRIT: 41.1 % (ref 36.0–46.0)
Hemoglobin: 13.8 g/dL (ref 12.0–15.0)
LYMPHS ABS: 1.5 10*3/uL (ref 0.7–4.0)
LYMPHS PCT: 22.8 % (ref 12.0–46.0)
MCHC: 33.7 g/dL (ref 30.0–36.0)
MCV: 88.7 fl (ref 78.0–100.0)
MONO ABS: 0.7 10*3/uL (ref 0.1–1.0)
Monocytes Relative: 10.6 % (ref 3.0–12.0)
NEUTROS ABS: 4.3 10*3/uL (ref 1.4–7.7)
NEUTROS PCT: 64.8 % (ref 43.0–77.0)
PLATELETS: 219 10*3/uL (ref 150.0–400.0)
RBC: 4.63 Mil/uL (ref 3.87–5.11)
RDW: 12.9 % (ref 11.5–15.5)
WBC: 6.6 10*3/uL (ref 4.0–10.5)

## 2017-10-04 LAB — COMPREHENSIVE METABOLIC PANEL
ALT: 13 U/L (ref 0–35)
AST: 13 U/L (ref 0–37)
Albumin: 4.2 g/dL (ref 3.5–5.2)
Alkaline Phosphatase: 70 U/L (ref 39–117)
BUN: 31 mg/dL — AB (ref 6–23)
CHLORIDE: 105 meq/L (ref 96–112)
CO2: 28 meq/L (ref 19–32)
Calcium: 9.8 mg/dL (ref 8.4–10.5)
Creatinine, Ser: 0.93 mg/dL (ref 0.40–1.20)
GFR: 66.36 mL/min (ref >60.00)
GLUCOSE: 83 mg/dL (ref 70–99)
Potassium: 3.8 mEq/L (ref 3.5–5.1)
SODIUM: 140 meq/L (ref 135–145)
Total Bilirubin: 0.4 mg/dL (ref 0.2–1.2)
Total Protein: 7.1 g/dL (ref 6.0–8.3)

## 2017-10-04 LAB — URINE CULTURE
MICRO NUMBER:: 90990784
SPECIMEN QUALITY:: ADEQUATE

## 2017-10-04 LAB — LIPASE: Lipase: 34 U/L (ref 11.0–59.0)

## 2017-10-05 ENCOUNTER — Other Ambulatory Visit: Payer: Self-pay | Admitting: Physician Assistant

## 2017-10-05 DIAGNOSIS — K8020 Calculus of gallbladder without cholecystitis without obstruction: Secondary | ICD-10-CM

## 2017-10-12 ENCOUNTER — Ambulatory Visit: Payer: Self-pay | Admitting: Surgery

## 2017-10-12 NOTE — H&P (Signed)
Surgical H&P  CC: abdominal pain  HPI: 57 year old woman referred for cholelithiasis and right upper quadrant pain.  She has had belching after eating that she eats or drinks for the last couple of months.  Last week had right upper quadrant pain which is sharp and shooting equality last about 30 minutes to an hour after dinner.  This happened 2 nights in a row And then a third time this weekend when she was eating some pork at a party. Gallbladder ultrasound on August 21 demonstrates a 1.6 cm gallstone without evidence of cholecystitis, common bile duct 6.7 mm. CMP is normal as is lipase and CBC on August 20..  Prior abdominal surgeries include laparoscopic gastric sleeve resection Doreatha Massed), hernia repair, C-section, laparoscopic assisted transvaginal hysterectomy, and abdominoplasty/panniculectomy.  Medical problems include hypertension, GERD, diabetes, asthma, strong family history of breast/colon/ovarian/pancreatic cancer. Last colonoscopy was about 2 years ago and was unremarkable per patient.  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  She is anticipating going to a directors conference in mid September.  Allergies  Allergen Reactions  . Vicodin [Hydrocodone-Acetaminophen] Itching    Past Medical History:  Diagnosis Date  . Allergy   . Asthma    seasonal  . Cancer ([place])    melanoma- left leg  . Colon polyps   . Diabetes mellitus without complication (HCC)    hx, not any longer  . Family history of breast cancer   . Family history of colon cancer   . Family history of ovarian cancer   . Family history of pancreatic cancer   . GERD (gastroesophageal reflux disease)   . H/O hiatal hernia    had surgery to fix with last surgery  . Hypertension     Past Surgical History:  Procedure Laterality Date  . ABDOMINAL HYSTERECTOMY    . ABDOMINOPLASTY/PANNICULECTOMY WITH LIPOSUCTION N/A 12/31/2012   Procedure: PANNICULECTOMY WITH LIPOSUCTION;   Surgeon: Cristine Polio, MD;  Location: [facility];  Service: Plastics;  Laterality: N/A;  . CESAREAN SECTION    . COLONOSCOPY    . HERNIA REPAIR    . LAPAROSCOPIC GASTRIC SLEEVE RESECTION    . LITHOTRIPSY    . MELANOMA EXCISION    . TONSILLECTOMY      Family History  Problem Relation Age of Onset  . Colon cancer Mother        gallbladder cancer spread to colon  . Cancer Mother 61       gallbladder  . Lung cancer Father 58  . Pancreatic cancer Brother 36       Maternal 1/2 brother  . Breast cancer Paternal Aunt 58  . Kidney cancer Maternal Grandmother   . Lung cancer Maternal Grandfather   . Breast cancer Paternal Grandmother        dx in her 49s  . Ovarian cancer Paternal Grandmother        dx in her 75s  . Esophageal cancer Neg Hx   . Rectal cancer Neg Hx   . Stomach cancer Neg Hx     Social History   Socioeconomic History  . Marital status: Married    Spouse name: Not on file  . Number of children: 2  . Years of education: Not on file  . Highest education level: Not on file  Occupational History  . Not on file  Social Needs  . Financial resource strain: Not on file  . Food insecurity:    Worry: Not on file  Inability: Not on file  . Transportation needs:    Medical: Not on file    Non-medical: Not on file  Tobacco Use  . Smoking status: Former Smoker    Last attempt to quit: 02/14/2009    Years since quitting: 8.6  . Smokeless tobacco: Never Used  Substance and Sexual Activity  . Alcohol use: No  . Drug use: No  . Sexual activity: Not on file  Lifestyle  . Physical activity:    Days per week: Not on file    Minutes per session: Not on file  . Stress: Not on file  Relationships  . Social connections:    Talks on phone: Not on file    Gets together: Not on file    Attends religious service: Not on file    Active member of club or organization: Not on file    Attends meetings of clubs or organizations: Not on file    Relationship status: Not on file   Other Topics Concern  . Not on file  Social History Narrative  . Not on file    No current outpatient medications on file prior to visit.   No current facility-administered medications on file prior to visit.     Review of Systems: a complete, 10pt review of systems was completed with pertinent positives and negatives as documented in the HPI  Physical Exam: There were no vitals filed for this visit. Gen: A&Ox3, no distress  Head: normocephalic, atraumatic Eyes: extraocular motions intact, anicteric.  Neck: supple without mass or thyromegaly Chest: unlabored respirations, symmetrical air entry, clear bilaterally   Cardiovascular: RRR with palpable distal pulses, no pedal edema Abdomen: soft, nondistended, nontender. No organomegaly. In the right lateral abdominal wall feels like the colon full of stool is palpable versus possible hernia. Extremities: warm, without edema, no deformities  Neuro: grossly intact Psych: appropriate mood and affect, normal insight  Skin: warm and dry   CBC Latest Ref Rng & Units 10/03/2017 06/09/2017 03/19/2015  WBC 4.0 - 10.5 K/uL 6.6 4.9 6.6  Hemoglobin 12.0 - 15.0 g/dL 13.8 14.0 12.7  Hematocrit 36.0 - 46.0 % 41.1 40.9 38.9  Platelets 150.0 - 400.0 K/uL 219.0 217 191    CMP Latest Ref Rng & Units 10/03/2017 06/09/2017 06/09/2017  Glucose 70 - 99 mg/dL 83 - 88  BUN 6 - 23 mg/dL 31(H) - 22  Creatinine 0.40 - 1.20 mg/dL 0.93 - 0.89  Sodium 135 - 145 mEq/L 140 - 144  Potassium 3.5 - 5.1 mEq/L 3.8 - 4.4  Chloride 96 - 112 mEq/L 105 - 106  CO2 19 - 32 mEq/L 28 - 24  Calcium 8.4 - 10.5 mg/dL 9.8 9.6 9.5  Total Protein 6.0 - 8.3 g/dL 7.1 - 6.5  Total Bilirubin 0.2 - 1.2 mg/dL 0.4 - 0.5  Alkaline Phos 39 - 117 U/L 70 - -  AST 0 - 37 U/L 13 - 15  ALT 0 - 35 U/L 13 - 11    Lab Results  Component Value Date   INR 1.04 12/31/2012    Imaging: No results found.   A/P: BILIARY COLIC (L87.56) Story: Her symptoms are classic for biliary colic and  she has demonstrated stones. I recommend proceeding with laparoscopic cholecystectomy. Discussed risks of surgery including bleeding, pain, scarring, intraabdominal injury specifically to the common bile duct and sequelae, conversion to open surgery, failure to resolve symptoms, blood clots/ pulmonary embolus, heart attack, pneumonia, stroke, death. Questions welcomed and answered to patient's satisfaction. She  would like to schedule as early as possible.   Romana Juniper, MD [facility], [place] Pager [phone]

## 2017-10-12 NOTE — H&P (View-Only) (Signed)
Surgical H&P  CC: abdominal pain  HPI: 57 year old woman referred for cholelithiasis and right upper quadrant pain.  She has had belching after eating that she eats or drinks for the last couple of months.  Last week had right upper quadrant pain which is sharp and shooting equality last about 30 minutes to an hour after dinner.  This happened 2 nights in a row And then a third time this weekend when she was eating some pork at a party. Gallbladder ultrasound on August 21 demonstrates a 1.6 cm gallstone without evidence of cholecystitis, common bile duct 6.7 mm. CMP is normal as is lipase and CBC on August 20..  Prior abdominal surgeries include laparoscopic gastric sleeve resection Doreatha Massed), hernia repair, C-section, laparoscopic assisted transvaginal hysterectomy, and abdominoplasty/panniculectomy.  Medical problems include hypertension, GERD, diabetes, asthma, strong family history of breast/colon/ovarian/pancreatic cancer. Last colonoscopy was about 2 years ago and was unremarkable per patient.  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  She is anticipating going to a directors conference in mid September.  Allergies  Allergen Reactions  . Vicodin [Hydrocodone-Acetaminophen] Itching    Past Medical History:  Diagnosis Date  . Allergy   . Asthma    seasonal  . Cancer ([place])    melanoma- left leg  . Colon polyps   . Diabetes mellitus without complication (HCC)    hx, not any longer  . Family history of breast cancer   . Family history of colon cancer   . Family history of ovarian cancer   . Family history of pancreatic cancer   . GERD (gastroesophageal reflux disease)   . H/O hiatal hernia    had surgery to fix with last surgery  . Hypertension     Past Surgical History:  Procedure Laterality Date  . ABDOMINAL HYSTERECTOMY    . ABDOMINOPLASTY/PANNICULECTOMY WITH LIPOSUCTION N/A 12/31/2012   Procedure: PANNICULECTOMY WITH LIPOSUCTION;   Surgeon: Cristine Polio, MD;  Location: [facility];  Service: Plastics;  Laterality: N/A;  . CESAREAN SECTION    . COLONOSCOPY    . HERNIA REPAIR    . LAPAROSCOPIC GASTRIC SLEEVE RESECTION    . LITHOTRIPSY    . MELANOMA EXCISION    . TONSILLECTOMY      Family History  Problem Relation Age of Onset  . Colon cancer Mother        gallbladder cancer spread to colon  . Cancer Mother 16       gallbladder  . Lung cancer Father 37  . Pancreatic cancer Brother 48       Maternal 1/2 brother  . Breast cancer Paternal Aunt 66  . Kidney cancer Maternal Grandmother   . Lung cancer Maternal Grandfather   . Breast cancer Paternal Grandmother        dx in her 71s  . Ovarian cancer Paternal Grandmother        dx in her 21s  . Esophageal cancer Neg Hx   . Rectal cancer Neg Hx   . Stomach cancer Neg Hx     Social History   Socioeconomic History  . Marital status: Married    Spouse name: Not on file  . Number of children: 2  . Years of education: Not on file  . Highest education level: Not on file  Occupational History  . Not on file  Social Needs  . Financial resource strain: Not on file  . Food insecurity:    Worry: Not on file  Inability: Not on file  . Transportation needs:    Medical: Not on file    Non-medical: Not on file  Tobacco Use  . Smoking status: Former Smoker    Last attempt to quit: 02/14/2009    Years since quitting: 8.6  . Smokeless tobacco: Never Used  Substance and Sexual Activity  . Alcohol use: No  . Drug use: No  . Sexual activity: Not on file  Lifestyle  . Physical activity:    Days per week: Not on file    Minutes per session: Not on file  . Stress: Not on file  Relationships  . Social connections:    Talks on phone: Not on file    Gets together: Not on file    Attends religious service: Not on file    Active member of club or organization: Not on file    Attends meetings of clubs or organizations: Not on file    Relationship status: Not on file   Other Topics Concern  . Not on file  Social History Narrative  . Not on file    No current outpatient medications on file prior to visit.   No current facility-administered medications on file prior to visit.     Review of Systems: a complete, 10pt review of systems was completed with pertinent positives and negatives as documented in the HPI  Physical Exam: There were no vitals filed for this visit. Gen: A&Ox3, no distress  Head: normocephalic, atraumatic Eyes: extraocular motions intact, anicteric.  Neck: supple without mass or thyromegaly Chest: unlabored respirations, symmetrical air entry, clear bilaterally   Cardiovascular: RRR with palpable distal pulses, no pedal edema Abdomen: soft, nondistended, nontender. No organomegaly. In the right lateral abdominal wall feels like the colon full of stool is palpable versus possible hernia. Extremities: warm, without edema, no deformities  Neuro: grossly intact Psych: appropriate mood and affect, normal insight  Skin: warm and dry   CBC Latest Ref Rng & Units 10/03/2017 06/09/2017 03/19/2015  WBC 4.0 - 10.5 K/uL 6.6 4.9 6.6  Hemoglobin 12.0 - 15.0 g/dL 13.8 14.0 12.7  Hematocrit 36.0 - 46.0 % 41.1 40.9 38.9  Platelets 150.0 - 400.0 K/uL 219.0 217 191    CMP Latest Ref Rng & Units 10/03/2017 06/09/2017 06/09/2017  Glucose 70 - 99 mg/dL 83 - 88  BUN 6 - 23 mg/dL 31(H) - 22  Creatinine 0.40 - 1.20 mg/dL 0.93 - 0.89  Sodium 135 - 145 mEq/L 140 - 144  Potassium 3.5 - 5.1 mEq/L 3.8 - 4.4  Chloride 96 - 112 mEq/L 105 - 106  CO2 19 - 32 mEq/L 28 - 24  Calcium 8.4 - 10.5 mg/dL 9.8 9.6 9.5  Total Protein 6.0 - 8.3 g/dL 7.1 - 6.5  Total Bilirubin 0.2 - 1.2 mg/dL 0.4 - 0.5  Alkaline Phos 39 - 117 U/L 70 - -  AST 0 - 37 U/L 13 - 15  ALT 0 - 35 U/L 13 - 11    Lab Results  Component Value Date   INR 1.04 12/31/2012    Imaging: No results found.   A/P: BILIARY COLIC (N36.14) Story: Her symptoms are classic for biliary colic and  she has demonstrated stones. I recommend proceeding with laparoscopic cholecystectomy. Discussed risks of surgery including bleeding, pain, scarring, intraabdominal injury specifically to the common bile duct and sequelae, conversion to open surgery, failure to resolve symptoms, blood clots/ pulmonary embolus, heart attack, pneumonia, stroke, death. Questions welcomed and answered to patient's satisfaction. She  would like to schedule as early as possible.   Romana Juniper, MD [facility] Surgery, [place] Pager [phone]

## 2017-10-17 ENCOUNTER — Other Ambulatory Visit: Payer: Self-pay

## 2017-10-17 ENCOUNTER — Encounter (HOSPITAL_COMMUNITY)
Admission: RE | Admit: 2017-10-17 | Discharge: 2017-10-17 | Disposition: A | Discharge status: Home / Self Care | Payer: 59 | Source: Ambulatory Visit | Attending: Surgery | Admitting: Surgery

## 2017-10-17 ENCOUNTER — Encounter (HOSPITAL_COMMUNITY): Payer: Self-pay

## 2017-10-17 DIAGNOSIS — K8050 Calculus of bile duct without cholangitis or cholecystitis without obstruction: Secondary | ICD-10-CM | POA: Insufficient documentation

## 2017-10-17 DIAGNOSIS — R001 Bradycardia, unspecified: Secondary | ICD-10-CM | POA: Insufficient documentation

## 2017-10-17 DIAGNOSIS — Z01818 Encounter for other preprocedural examination: Secondary | ICD-10-CM | POA: Insufficient documentation

## 2017-10-17 DIAGNOSIS — I10 Essential (primary) hypertension: Secondary | ICD-10-CM | POA: Diagnosis not present

## 2017-10-17 LAB — BASIC METABOLIC PANEL
Anion gap: 8 (ref 5–15)
BUN: 26 mg/dL — AB (ref 6–20)
CALCIUM: 9.3 mg/dL (ref 8.9–10.3)
CO2: 27 mmol/L (ref 22–32)
Chloride: 110 mmol/L (ref 98–111)
Creatinine, Ser: 0.77 mg/dL (ref 0.44–1.00)
GFR calc Af Amer: >60 mL/min (ref >60)
GFR calc non Af Amer: >60 mL/min (ref >60)
Glucose, Bld: 100 mg/dL — ABNORMAL HIGH (ref 70–99)
Potassium: 4.3 mmol/L (ref 3.5–5.1)
SODIUM: 145 mmol/L (ref 135–145)

## 2017-10-17 LAB — CBC
HCT: 43.3 % (ref 36.0–46.0)
Hemoglobin: 14.4 g/dL (ref 12.0–15.0)
MCH: 29.4 pg (ref 26.0–34.0)
MCHC: 33.3 g/dL (ref 30.0–36.0)
MCV: 88.4 fL (ref 78.0–100.0)
PLATELETS: 203 10*3/uL (ref 150–400)
RBC: 4.9 MIL/uL (ref 3.87–5.11)
RDW: 12.7 % (ref 11.5–15.5)
WBC: 3.9 10*3/uL — AB (ref 4.0–10.5)

## 2017-10-17 LAB — HEMOGLOBIN A1C
Hgb A1c MFr Bld: 5.1 % (ref 4.8–5.6)
Mean Plasma Glucose: 99.67 mg/dL

## 2017-10-17 NOTE — Progress Notes (Signed)
10-17-17 BMP result routed to Dr. Kae Heller for review.

## 2017-10-17 NOTE — Patient Instructions (Addendum)
Megan Petty  10/17/2017   Your procedure is scheduled on: 11-03-17   Report to [facility] Main  Entrance    Report to Admitting at 9:00 AM    Call this number if you have problems the morning of surgery [phone]   Remember: Do not eat food or drink liquids :After Midnight.     Take these medicines the morning of surgery with A SIP OF WATER: None                                 You may not have any metal on your body including hair pins and              piercings  Do not wear jewelry, make-up, lotions, powders or perfumes, deodorant             Do not wear nail polish.  Do not shave  48 hours prior to surgery.                 Do not bring valuables to the hospital. [place].  Contacts, dentures or bridgework may not be worn into surgery. .     Patients discharged the day of surgery will not be allowed to drive home.  Name and phone number of your driver: Romell Cavanah [phone]  Special Instructions: N/A              Please read over the following fact sheets you were given: _____________________________________________________________________             [facility] - Preparing for Surgery Before surgery, you can play an important role.  Because skin is not sterile, your skin needs to be as free of germs as possible.  You can reduce the number of germs on your skin by washing with CHG (chlorahexidine gluconate) soap before surgery.  CHG is an antiseptic cleaner which kills germs and bonds with the skin to continue killing germs even after washing. Please DO NOT use if you have an allergy to CHG or antibacterial soaps.  If your skin becomes reddened/irritated stop using the CHG and inform your nurse when you arrive at Short Stay. Do not shave (including legs and underarms) for at least 48 hours prior to the first CHG shower.  You may shave your face/neck. Please follow these instructions  carefully:  1.  Shower with CHG Soap the night before surgery and the  morning of Surgery.  2.  If you choose to wash your hair, wash your hair first as usual with your  normal  shampoo.  3.  After you shampoo, rinse your hair and body thoroughly to remove the  shampoo.                           4.  Use CHG as you would any other liquid soap.  You can apply chg directly  to the skin and wash                       Gently with a scrungie or clean washcloth.  5.  Apply the CHG Soap to your body ONLY FROM THE NECK DOWN.   Do not  use on face/ open                           Wound or open sores. Avoid contact with eyes, ears mouth and genitals (private parts).                       Wash face,  Genitals (private parts) with your normal soap.             6.  Wash thoroughly, paying special attention to the area where your surgery  will be performed.  7.  Thoroughly rinse your body with warm water from the neck down.  8.  DO NOT shower/wash with your normal soap after using and rinsing off  the CHG Soap.                9.  Pat yourself dry with a clean towel.            10.  Wear clean pajamas.            11.  Place clean sheets on your bed the night of your first shower and do not  sleep with pets. Day of Surgery : Do not apply any lotions/deodorants the morning of surgery.  Please wear clean clothes to the hospital/surgery center.  FAILURE TO FOLLOW THESE INSTRUCTIONS MAY RESULT IN THE CANCELLATION OF YOUR SURGERY PATIENT SIGNATURE_________________________________  NURSE SIGNATURE__________________________________  ________________________________________________________________________

## 2017-11-02 MED ORDER — BUPIVACAINE LIPOSOME 1.3 % IJ SUSP
20.0000 mL | Freq: Once | INTRAMUSCULAR | Status: DC
  Filled 2017-11-02: qty 20

## 2017-11-03 ENCOUNTER — Ambulatory Visit (HOSPITAL_COMMUNITY)
Admission: RE | Admit: 2017-11-03 | Discharge: 2017-11-03 | Disposition: A | Discharge status: Home / Self Care | Payer: 59 | Source: Ambulatory Visit | Attending: Surgery | Admitting: Surgery

## 2017-11-03 ENCOUNTER — Encounter (HOSPITAL_COMMUNITY): Payer: Self-pay | Admitting: Anesthesiology

## 2017-11-03 ENCOUNTER — Other Ambulatory Visit: Payer: Self-pay

## 2017-11-03 ENCOUNTER — Encounter (HOSPITAL_COMMUNITY)
Admission: RE | Disposition: A | Discharge status: Home / Self Care | Payer: Self-pay | Source: Ambulatory Visit | Attending: Surgery

## 2017-11-03 ENCOUNTER — Ambulatory Visit (HOSPITAL_COMMUNITY): Payer: 59 | Admitting: Anesthesiology

## 2017-11-03 DIAGNOSIS — Z803 Family history of malignant neoplasm of breast: Secondary | ICD-10-CM | POA: Diagnosis not present

## 2017-11-03 DIAGNOSIS — E119 Type 2 diabetes mellitus without complications: Secondary | ICD-10-CM | POA: Insufficient documentation

## 2017-11-03 DIAGNOSIS — Z800 Family history of malignant neoplasm of digestive organs: Secondary | ICD-10-CM | POA: Diagnosis not present

## 2017-11-03 DIAGNOSIS — K8010 Calculus of gallbladder with chronic cholecystitis without obstruction: Secondary | ICD-10-CM | POA: Diagnosis not present

## 2017-11-03 DIAGNOSIS — I10 Essential (primary) hypertension: Secondary | ICD-10-CM | POA: Insufficient documentation

## 2017-11-03 DIAGNOSIS — Z85820 Personal history of malignant melanoma of skin: Secondary | ICD-10-CM | POA: Diagnosis not present

## 2017-11-03 DIAGNOSIS — Z9884 Bariatric surgery status: Secondary | ICD-10-CM | POA: Diagnosis not present

## 2017-11-03 DIAGNOSIS — K219 Gastro-esophageal reflux disease without esophagitis: Secondary | ICD-10-CM | POA: Diagnosis not present

## 2017-11-03 DIAGNOSIS — K660 Peritoneal adhesions (postprocedural) (postinfection): Secondary | ICD-10-CM | POA: Insufficient documentation

## 2017-11-03 DIAGNOSIS — J45998 Other asthma: Secondary | ICD-10-CM | POA: Insufficient documentation

## 2017-11-03 DIAGNOSIS — Z87891 Personal history of nicotine dependence: Secondary | ICD-10-CM | POA: Insufficient documentation

## 2017-11-03 DIAGNOSIS — Z90710 Acquired absence of both cervix and uterus: Secondary | ICD-10-CM | POA: Insufficient documentation

## 2017-11-03 DIAGNOSIS — Z86010 Personal history of colonic polyps: Secondary | ICD-10-CM | POA: Insufficient documentation

## 2017-11-03 DIAGNOSIS — Z8041 Family history of malignant neoplasm of ovary: Secondary | ICD-10-CM | POA: Insufficient documentation

## 2017-11-03 DIAGNOSIS — Z885 Allergy status to narcotic agent status: Secondary | ICD-10-CM | POA: Insufficient documentation

## 2017-11-03 DIAGNOSIS — K8050 Calculus of bile duct without cholangitis or cholecystitis without obstruction: Secondary | ICD-10-CM | POA: Diagnosis present

## 2017-11-03 HISTORY — PX: CHOLECYSTECTOMY: SHX55

## 2017-11-03 SURGERY — LAPAROSCOPIC CHOLECYSTECTOMY
Anesthesia: General

## 2017-11-03 MED ORDER — LACTATED RINGERS IV SOLN
INTRAVENOUS | Status: DC
  Administered 2017-11-03: 10:00:00 via INTRAVENOUS

## 2017-11-03 MED ORDER — DOCUSATE SODIUM 100 MG PO CAPS: 100.0000 mg | ORAL_CAPSULE | Freq: Two times a day (BID) | ORAL | 0 refills | Status: AC

## 2017-11-03 MED ORDER — BUPIVACAINE-EPINEPHRINE (PF) 0.25% -1:200000 IJ SOLN
INTRAMUSCULAR | Status: AC
  Filled 2017-11-03: qty 30

## 2017-11-03 MED ORDER — FENTANYL CITRATE (PF) 100 MCG/2ML IJ SOLN
INTRAMUSCULAR | Status: AC
  Filled 2017-11-03: qty 2

## 2017-11-03 MED ORDER — ACETAMINOPHEN 650 MG RE SUPP
650.0000 mg | RECTAL | Status: DC | PRN
  Filled 2017-11-03: qty 1

## 2017-11-03 MED ORDER — FENTANYL CITRATE (PF) 100 MCG/2ML IJ SOLN: 25.0000 ug | INTRAMUSCULAR | Status: DC | PRN

## 2017-11-03 MED ORDER — MIDAZOLAM HCL 5 MG/5ML IJ SOLN
INTRAMUSCULAR | Status: DC | PRN
  Administered 2017-11-03: 2 mg via INTRAVENOUS

## 2017-11-03 MED ORDER — GLYCOPYRROLATE PF 0.2 MG/ML IJ SOSY
PREFILLED_SYRINGE | INTRAMUSCULAR | Status: AC
  Filled 2017-11-03: qty 1

## 2017-11-03 MED ORDER — DEXAMETHASONE SODIUM PHOSPHATE 10 MG/ML IJ SOLN
INTRAMUSCULAR | Status: DC | PRN
  Administered 2017-11-03: 10 mg via INTRAVENOUS

## 2017-11-03 MED ORDER — FENTANYL CITRATE (PF) 100 MCG/2ML IJ SOLN
INTRAMUSCULAR | Status: AC
  Administered 2017-11-03: 25 ug via INTRAVENOUS
  Filled 2017-11-03: qty 2

## 2017-11-03 MED ORDER — OXYCODONE HCL 5 MG PO TABS: 5.0000 mg | ORAL_TABLET | ORAL | Status: DC | PRN

## 2017-11-03 MED ORDER — MIDAZOLAM HCL 2 MG/2ML IJ SOLN
INTRAMUSCULAR | Status: AC
  Filled 2017-11-03: qty 2

## 2017-11-03 MED ORDER — GABAPENTIN 300 MG PO CAPS
300.0000 mg | ORAL_CAPSULE | ORAL | Status: AC
  Administered 2017-11-03: 300 mg via ORAL
  Filled 2017-11-03: qty 1

## 2017-11-03 MED ORDER — CHLORHEXIDINE GLUCONATE 4 % EX LIQD: 60.0000 mL | Freq: Once | CUTANEOUS | Status: DC

## 2017-11-03 MED ORDER — SODIUM CHLORIDE 0.9% FLUSH: 3.0000 mL | INTRAVENOUS | Status: DC | PRN

## 2017-11-03 MED ORDER — ONDANSETRON HCL 4 MG/2ML IJ SOLN
INTRAMUSCULAR | Status: DC | PRN
  Administered 2017-11-03: 4 mg via INTRAVENOUS

## 2017-11-03 MED ORDER — MEPERIDINE HCL 50 MG/ML IJ SOLN
6.2500 mg | INTRAMUSCULAR | Status: DC | PRN
  Administered 2017-11-03: 12.5 mg via INTRAVENOUS

## 2017-11-03 MED ORDER — ACETAMINOPHEN 500 MG PO TABS
1000.0000 mg | ORAL_TABLET | ORAL | Status: AC
  Administered 2017-11-03: 1000 mg via ORAL
  Filled 2017-11-03: qty 2

## 2017-11-03 MED ORDER — PROPOFOL 10 MG/ML IV BOLUS
INTRAVENOUS | Status: DC | PRN
  Administered 2017-11-03: 180 mg via INTRAVENOUS

## 2017-11-03 MED ORDER — LACTATED RINGERS IR SOLN
Status: DC | PRN
  Administered 2017-11-03: 1000 mL

## 2017-11-03 MED ORDER — ROCURONIUM BROMIDE 10 MG/ML (PF) SYRINGE
PREFILLED_SYRINGE | INTRAVENOUS | Status: DC | PRN
  Administered 2017-11-03: 10 mg via INTRAVENOUS
  Administered 2017-11-03: 50 mg via INTRAVENOUS

## 2017-11-03 MED ORDER — EPHEDRINE SULFATE-NACL 50-0.9 MG/10ML-% IV SOSY
PREFILLED_SYRINGE | INTRAVENOUS | Status: DC | PRN
  Administered 2017-11-03 (×2): 10 mg via INTRAVENOUS

## 2017-11-03 MED ORDER — FENTANYL CITRATE (PF) 100 MCG/2ML IJ SOLN
INTRAMUSCULAR | Status: DC | PRN
  Administered 2017-11-03: 100 ug via INTRAVENOUS
  Administered 2017-11-03 (×2): 50 ug via INTRAVENOUS

## 2017-11-03 MED ORDER — SUGAMMADEX SODIUM 200 MG/2ML IV SOLN
INTRAVENOUS | Status: DC | PRN
  Administered 2017-11-03: 200 mg via INTRAVENOUS

## 2017-11-03 MED ORDER — EPHEDRINE 5 MG/ML INJ
INTRAVENOUS | Status: AC
  Filled 2017-11-03: qty 10

## 2017-11-03 MED ORDER — GLYCOPYRROLATE PF 0.2 MG/ML IJ SOSY
PREFILLED_SYRINGE | INTRAMUSCULAR | Status: DC | PRN
  Administered 2017-11-03: .2 mg via INTRAVENOUS

## 2017-11-03 MED ORDER — LIDOCAINE 2% (20 MG/ML) 5 ML SYRINGE
INTRAMUSCULAR | Status: DC | PRN
  Administered 2017-11-03: 100 mg via INTRAVENOUS

## 2017-11-03 MED ORDER — BUPIVACAINE-EPINEPHRINE 0.25% -1:200000 IJ SOLN
INTRAMUSCULAR | Status: DC | PRN
  Administered 2017-11-03: 20 mL

## 2017-11-03 MED ORDER — PROPOFOL 10 MG/ML IV BOLUS
INTRAVENOUS | Status: AC
  Filled 2017-11-03: qty 20

## 2017-11-03 MED ORDER — DIPHENHYDRAMINE HCL 50 MG/ML IJ SOLN
INTRAMUSCULAR | Status: AC
  Filled 2017-11-03: qty 1

## 2017-11-03 MED ORDER — SUGAMMADEX SODIUM 200 MG/2ML IV SOLN
INTRAVENOUS | Status: AC
  Filled 2017-11-03: qty 4

## 2017-11-03 MED ORDER — CEFAZOLIN SODIUM-DEXTROSE 2-4 GM/100ML-% IV SOLN
2.0000 g | INTRAVENOUS | Status: AC
  Administered 2017-11-03: 2 g via INTRAVENOUS
  Filled 2017-11-03: qty 100

## 2017-11-03 MED ORDER — PROMETHAZINE HCL 25 MG/ML IJ SOLN: 6.2500 mg | INTRAMUSCULAR | Status: DC | PRN

## 2017-11-03 MED ORDER — MEPERIDINE HCL 50 MG/ML IJ SOLN
INTRAMUSCULAR | Status: AC
  Administered 2017-11-03: 12.5 mg via INTRAVENOUS
  Filled 2017-11-03: qty 1

## 2017-11-03 MED ORDER — FENTANYL CITRATE (PF) 100 MCG/2ML IJ SOLN
25.0000 ug | INTRAMUSCULAR | Status: DC | PRN
  Administered 2017-11-03: 25 ug via INTRAVENOUS

## 2017-11-03 MED ORDER — ACETAMINOPHEN 325 MG PO TABS: 650.0000 mg | ORAL_TABLET | ORAL | Status: DC | PRN

## 2017-11-03 MED ORDER — 0.9 % SODIUM CHLORIDE (POUR BTL) OPTIME
TOPICAL | Status: DC | PRN
  Administered 2017-11-03: 1000 mL

## 2017-11-03 MED ORDER — HYDROCODONE-ACETAMINOPHEN 5-325 MG PO TABS: 1.0000 | ORAL_TABLET | Freq: Four times a day (QID) | ORAL | 0 refills | Status: DC | PRN

## 2017-11-03 MED ORDER — SODIUM CHLORIDE 0.9% FLUSH: 3.0000 mL | Freq: Two times a day (BID) | INTRAVENOUS | Status: DC

## 2017-11-03 MED ORDER — SODIUM CHLORIDE 0.9 % IV SOLN: 250.0000 mL | INTRAVENOUS | Status: DC | PRN

## 2017-11-03 MED ORDER — LIP MEDEX EX OINT
TOPICAL_OINTMENT | CUTANEOUS | Status: AC
  Filled 2017-11-03: qty 7

## 2017-11-03 MED ORDER — DIPHENHYDRAMINE HCL 50 MG/ML IJ SOLN
INTRAMUSCULAR | Status: DC | PRN
  Administered 2017-11-03: 12.5 mg via INTRAVENOUS

## 2017-11-03 SURGICAL SUPPLY — 33 items
APPLIER CLIP 5 13 M/L LIGAMAX5 (MISCELLANEOUS) ×3
APPLIER CLIP ROT 10 11.4 M/L (STAPLE) ×3
CABLE HIGH FREQUENCY MONO STRZ (ELECTRODE) ×3 IMPLANT
CHLORAPREP W/TINT 26ML (MISCELLANEOUS) ×3 IMPLANT
CLIP APPLIE 5 13 M/L LIGAMAX5 (MISCELLANEOUS) ×1 IMPLANT
CLIP APPLIE ROT 10 11.4 M/L (STAPLE) ×1 IMPLANT
COVER MAYO STAND STRL (DRAPES) IMPLANT
COVER SURGICAL LIGHT HANDLE (MISCELLANEOUS) ×3 IMPLANT
DECANTER SPIKE VIAL GLASS SM (MISCELLANEOUS) ×3 IMPLANT
DERMABOND ADVANCED (GAUZE/BANDAGES/DRESSINGS) ×2
DERMABOND ADVANCED .7 DNX12 (GAUZE/BANDAGES/DRESSINGS) ×1 IMPLANT
DRAPE C-ARM 42X120 X-RAY (DRAPES) IMPLANT
ELECT REM PT RETURN 15FT ADLT (MISCELLANEOUS) ×3 IMPLANT
GLOVE BIO SURGEON STRL SZ 6 (GLOVE) ×3 IMPLANT
GLOVE INDICATOR 6.5 STRL GRN (GLOVE) ×3 IMPLANT
GOWN STRL REUS W/TWL LRG LVL3 (GOWN DISPOSABLE) ×3 IMPLANT
GOWN STRL REUS W/TWL XL LVL3 (GOWN DISPOSABLE) ×6 IMPLANT
GRASPER SUT TROCAR 14GX15 (MISCELLANEOUS) ×3 IMPLANT
HEMOSTAT SNOW SURGICEL 2X4 (HEMOSTASIS) IMPLANT
KIT BASIN OR (CUSTOM PROCEDURE TRAY) ×3 IMPLANT
NEEDLE INSUFFLATION 14GA 120MM (NEEDLE) ×3 IMPLANT
POUCH SPECIMEN RETRIEVAL 10MM (ENDOMECHANICALS) ×3 IMPLANT
SCISSORS LAP 5X35 DISP (ENDOMECHANICALS) ×3 IMPLANT
SET CHOLANGIOGRAPH MIX (MISCELLANEOUS) IMPLANT
SET IRRIG TUBING LAPAROSCOPIC (IRRIGATION / IRRIGATOR) ×3 IMPLANT
SLEEVE XCEL OPT CAN 5 100 (ENDOMECHANICALS) ×6 IMPLANT
SUT MNCRL AB 4-0 PS2 18 (SUTURE) ×3 IMPLANT
TOWEL OR 17X26 10 PK STRL BLUE (TOWEL DISPOSABLE) ×3 IMPLANT
TOWEL OR NON WOVEN STRL DISP B (DISPOSABLE) IMPLANT
TRAY LAPAROSCOPIC (CUSTOM PROCEDURE TRAY) ×3 IMPLANT
TROCAR BLADELESS OPT 5 100 (ENDOMECHANICALS) ×3 IMPLANT
TROCAR XCEL 12X100 BLDLESS (ENDOMECHANICALS) ×3 IMPLANT
TUBING INSUF HEATED (TUBING) ×3 IMPLANT

## 2017-11-03 NOTE — Interval H&P Note (Signed)
History and Physical Interval Note:  11/03/2017 10:10 AM  Megan Petty  has presented today for surgery, with the diagnosis of BILIARY COLIC  The various methods of treatment have been discussed with the patient and family. After consideration of risks, benefits and other options for treatment, the patient has consented to  Procedure(s): LAPAROSCOPIC CHOLECYSTECTOMY (N/A) as a surgical intervention .  The patient's history has been reviewed, patient examined, no change in status, stable for surgery.  I have reviewed the patient's chart and labs.  Questions were answered to the patient's satisfaction.     Chelsea Rich Brave

## 2017-11-03 NOTE — Transfer of Care (Signed)
Immediate Anesthesia Transfer of Care Note  Patient: Megan Petty  Procedure(s) Performed: Procedure(s): LAPAROSCOPIC CHOLECYSTECTOMY (N/A)  Patient Location: PACU  Anesthesia Type:General  Level of Consciousness:  sedated, patient cooperative and responds to stimulation  Airway & Oxygen Therapy:Patient Spontanous Breathing and Patient connected to face mask oxgen  Post-op Assessment:  Report given to PACU RN and Post -op Vital signs reviewed and stable  Post vital signs:  Reviewed and stable  Last Vitals:  Vitals:   11/03/17 0910  BP: 122/78  Pulse: 71  Resp: 16  Temp: 36.7 C  SpO2: 528%    Complications: No apparent anesthesia complications

## 2017-11-03 NOTE — Anesthesia Postprocedure Evaluation (Signed)
Anesthesia Post Note  Patient: Megan Petty  Procedure(s) Performed: LAPAROSCOPIC CHOLECYSTECTOMY (N/A )     Patient location during evaluation: PACU Anesthesia Type: General Level of consciousness: sedated Pain management: pain level controlled Vital Signs Assessment: post-procedure vital signs reviewed and stable Respiratory status: spontaneous breathing and respiratory function stable Cardiovascular status: stable Postop Assessment: no apparent nausea or vomiting Anesthetic complications: no    Last Vitals:  Vitals:   11/03/17 1215 11/03/17 1230  BP: 119/75 114/83  Pulse: 66 66  Resp: 12 15  Temp: (!) 36.4 C (!) 36.4 C  SpO2: 99% 99%    Last Pain:  Vitals:   11/03/17 1215  TempSrc:   PainSc: 3                  Lukas Pelcher DANIEL

## 2017-11-03 NOTE — Anesthesia Preprocedure Evaluation (Signed)
Anesthesia Evaluation  Patient identified by MRN, date of birth, ID band Patient awake    Reviewed: Allergy & Precautions, NPO status , Patient's Chart, lab work & pertinent test results  History of Anesthesia Complications Negative for: history of anesthetic complications  Airway Mallampati: II  TM Distance: >3 FB Neck ROM: Full    Dental no notable dental hx. (+) Dental Advisory Given   Pulmonary former smoker,    Pulmonary exam normal        Cardiovascular hypertension, Normal cardiovascular exam     Neuro/Psych negative neurological ROS  negative psych ROS   GI/Hepatic Neg liver ROS, hiatal hernia, GERD  ,  Endo/Other  negative endocrine ROS  Renal/GU negative Renal ROS     Musculoskeletal   Abdominal   Peds  Hematology   Anesthesia Other Findings   Reproductive/Obstetrics                             Anesthesia Physical Anesthesia Plan  ASA: II  Anesthesia Plan: General   Post-op Pain Management:    Induction: Intravenous  PONV Risk Score and Plan: 4 or greater and Ondansetron, Dexamethasone, Scopolamine patch - Pre-op and Diphenhydramine  Airway Management Planned: Oral ETT  Additional Equipment:   Intra-op Plan:   Post-operative Plan: Extubation in OR  Informed Consent: I have reviewed the patients History and Physical, chart, labs and discussed the procedure including the risks, benefits and alternatives for the proposed anesthesia with the patient or authorized representative who has indicated his/her understanding and acceptance.   Dental advisory given  Plan Discussed with: CRNA and Anesthesiologist  Anesthesia Plan Comments:         Anesthesia Quick Evaluation

## 2017-11-03 NOTE — Anesthesia Procedure Notes (Signed)
Procedure Name: Intubation Date/Time: 11/03/2017 10:24 AM Performed by: Lavina Hamman, CRNA Pre-anesthesia Checklist: Patient identified, Emergency Drugs available, Suction available, Patient being monitored and Timeout performed Patient Re-evaluated:Patient Re-evaluated prior to induction Oxygen Delivery Method: Circle system utilized Preoxygenation: Pre-oxygenation with 100% oxygen Induction Type: IV induction Ventilation: Mask ventilation without difficulty Laryngoscope Size: Mac and 4 Grade View: Grade II Tube type: Oral Tube size: 7.0 mm Number of attempts: 1 Airway Equipment and Method: Stylet Placement Confirmation: ETT inserted through vocal cords under direct vision,  positive ETCO2,  CO2 detector and breath sounds checked- equal and bilateral Secured at: 22 cm Tube secured with: Tape Dental Injury: Teeth and Oropharynx as per pre-operative assessment

## 2017-11-03 NOTE — Op Note (Signed)
Operative Note  Megan Petty 57 y.o. female [id number]  11/03/2017  Surgeon: Clovis Riley MD  Assistant: Armandina Gemma MD  Procedure performed: Laparoscopic Cholecystectomy  Preop diagnosis: biliary colic Post-op diagnosis/intraop findings: same  Specimens: gallbladder  EBL: minimal  Complications: none  Description of procedure: After obtaining informed consent the patient was brought to the operating room. Prophylactic antibiotics were administered. SCD's were applied. General endotracheal anesthesia was initiated and a formal time-out was performed. The abdomen was prepped and draped in the usual sterile fashion and the abdomen was entered using visiport technique in the left upper quadrant after instilling the site with local.  Insufflation to 72mmHg was obtained and gross inspection revealed no evidence of injury from our entry or other intraabdominal abnormalities. Omental adhesions to the mesh under her neoumbilicus and along the inferior midline were noted.Three 81mm trocars were introduced in the left periumbilical, right midclavicular and right anterior axillary lines under direct visualization and following infiltration with local. Entry port was upsized to a 61mm trocar. The gallbladder was retracted cephalad and the infundibulum was retracted laterally. A combination of hook electrocautery and blunt dissection was utilized to clear the peritoneum from the neck and cystic duct, circumferentially isolating the cystic artery and cystic duct and lifting the gallbladder from the cystic plate. The critical view of safety was achieved with the cystic artery, cystic duct, and liver bed visualized between them with no other structures. The artery was clipped with a single clip proximally and distally and divided as was the cystic duct with two clips on the proximal end. The gallbladder was dissected from the liver plate using electrocautery. A posterior cystic artery branch was addressed  with another clip prior to dividing it from the gallbladder. Once freed the gallbladder was placed in an endocatch bag and removed through the epigastric trocar site. A small amount of bleeding on the liver bed was controlled with cautery. Some bile had been spilled from the gallbladder during its dissection from the liver bed. This was aspirated and the right upper quadrant was irrigated copiously until the effluent was clear. A small amount of bleeding from the peritoneum which has been stripped from the gallbladder neck was controlled with a clip. Hemostasis was once again confirmed, and reinspection of the abdomen revealed no injuries. The clips were well opposed without any bile leak from the duct or the liver bed. The 63mm trocar site in the epigastrium was closed with a 0 vicryl in the fascia under direct visualization using a PMI device. The abdomen was desufflated and all trocars removed. The skin incisions were closed with subcuticular monocryl and Dermabond. The patient was awakened, extubated and transported to the recovery room in stable condition.   All counts were correct at the completion of the case.

## 2017-11-03 NOTE — Discharge Instructions (Signed)
LAPAROSCOPIC SURGERY: POST OP INSTRUCTIONS  ######################################################################  EAT Gradually transition to a high fiber diet with a fiber supplement over the next few weeks after discharge.  Start with a pureed / full liquid diet (see below)  WALK Walk an hour a day.  Control your pain to do that.    CONTROL PAIN Control pain so that you can walk, sleep, tolerate sneezing/coughing, go up/down stairs.  HAVE A BOWEL MOVEMENT DAILY Keep your bowels regular to avoid problems.  OK to try a laxative to override constipation.  OK to use an antidairrheal to slow down diarrhea.  Call if not better after 2 tries  CALL IF YOU HAVE PROBLEMS/CONCERNS Call if you are still struggling despite following these instructions. Call if you have concerns not answered by these instructions  ######################################################################    1. DIET: Follow a light bland diet the first 24 hours after arrival home, such as soup, liquids, crackers, etc.  Be sure to include lots of fluids daily.  Avoid fast food or heavy meals as your are more likely to get nauseated.  Eat a low fat the next few days after surgery.   2. Take your usually prescribed home medications unless otherwise directed. 3. PAIN CONTROL: a. Pain is best controlled by a usual combination of three different methods TOGETHER: i. Ice/Heat ii. Over the counter pain medication iii. Prescription pain medication b. Most patients will experience some swelling and bruising around the incisions.  Ice packs or heating pads (30-60 minutes up to 6 times a day) will help. Use ice for the first few days to help decrease swelling and bruising, then switch to heat to help relax tight/sore spots and speed recovery.  Some people prefer to use ice alone, heat alone, alternating between ice & heat.  Experiment to what works for you.  Swelling and bruising can take several weeks to resolve.   c. It is  helpful to take an over-the-counter pain medication regularly for the first few weeks.  Choose one of the following that works best for you: i. Naproxen (Aleve, etc)  Two 251m tabs twice a day ii. Ibuprofen (Advil, etc) Three 2053mtabs four times a day (every meal & bedtime) iii. Acetaminophen (Tylenol, etc) 500-65044mour times a day (every meal & bedtime) d. A  prescription for pain medication (such as oxycodone, hydrocodone, etc) should be given to you upon discharge.  Take your pain medication as prescribed.  i. If you are having problems/concerns with the prescription medicine (does not control pain, nausea, vomiting, rash, itching, etc), please call us [phone] see if we need to switch you to a different pain medicine that will work better for you and/or control your side effect better. ii. If you need a refill on your pain medication, please contact your pharmacy.  They will contact our office to request authorization. Prescriptions will not be filled after 5 pm or on week-ends. 4. Avoid getting constipated.  Between the surgery and the pain medications, it is common to experience some constipation.  Increasing fluid intake and taking a fiber supplement (such as Metamucil, Citrucel, FiberCon, MiraLax, etc) 1-2 times a day regularly will usually help prevent this problem from occurring.  A mild laxative (prune juice, Milk of Magnesia, MiraLax, etc) should be taken according to package directions if there are no bowel movements after 48 hours.   5. Watch out for diarrhea.  If you have many loose bowel movements, simplify your diet to bland foods & liquids for  a few days.  Stop any stool softeners and decrease your fiber supplement.  Switching to mild anti-diarrheal medications (Kayopectate, Pepto Bismol) can help.  If this worsens or does not improve, please call us. 6. Wash / shower every day.  You may shower over the skin glue which is waterproof.  Continue to shower over incision(s) after  the dressing is off. 7. Skin glue will flake off after 1-2 weeks.  You may leave the incision open to air.  You may replace a dressing/Band-Aid to cover the incision for comfort if you wish.  8. ACTIVITIES as tolerated:   a. You may resume regular (light) daily activities beginning the next day--such as daily self-care, walking, climbing stairs--gradually increasing activities as tolerated.  If you can walk 30 minutes without difficulty, it is safe to try more intense activity such as jogging, treadmill, bicycling, low-impact aerobics, swimming, etc. b. Save the most intensive and strenuous activity for last such as sit-ups, heavy lifting, contact sports, etc  Refrain from any heavy lifting or straining until you are off narcotics for pain control.   c. DO NOT PUSH THROUGH PAIN.  Let pain be your guide: If it hurts to do something, don't do it.  Pain is your body warning you to avoid that activity for another week until the pain goes down. d. You may drive when you are no longer taking prescription pain medication, you can comfortably wear a seatbelt, and you can safely maneuver your car and apply brakes. e. Dennis Bast may have sexual intercourse when it is comfortable.  9. FOLLOW UP in our office a. Please call [facility] at [phone] to set up an appointment to see your surgeon in the office for a follow-up appointment approximately 2-3 weeks after your surgery. b. Make sure that you call for this appointment the day you arrive home to insure a convenient appointment time. 10. IF YOU HAVE DISABILITY OR FAMILY LEAVE FORMS, BRING THEM TO THE OFFICE FOR PROCESSING.  DO NOT GIVE THEM TO YOUR DOCTOR.   WHEN TO CALL us [phone]: 1. Poor pain control 2. Reactions / problems with new medications (rash/itching, nausea, etc)  3. Fever over 101.5 F (38.5 C) 4. Inability to urinate 5. Nausea and/or vomiting 6. Worsening swelling or bruising 7. Continued bleeding from incision. 8. Increased pain, redness, or  drainage from the incision   The clinic staff is available to answer your questions during regular business hours (8:30am-5pm).  Please dont hesitate to call and ask to speak to one of our nurses for clinical concerns.   If you have a medical emergency, go to the nearest emergency room or call 911.  A surgeon from [facility] is always on call at the [facility], [address], [place], [place], [place]  [address] ? MAIN: [phone] ? TOLL FREE: [phone] ?  FAX [phone] [email or web address]

## 2017-11-04 ENCOUNTER — Encounter (HOSPITAL_COMMUNITY): Payer: Self-pay | Admitting: Surgery

## 2017-12-10 DIAGNOSIS — Z9049 Acquired absence of other specified parts of digestive tract: Secondary | ICD-10-CM | POA: Insufficient documentation

## 2017-12-10 NOTE — Progress Notes (Addendum)
Megan Petty is a 57 y.o. female is here for follow up.  History of Present Illness:   HPI: Gallbladder surgery since last visit. Doing very well. No diarrhea or food intolerance.   Health Maintenance Due  Topic Date Due  . PNEUMOCOCCAL POLYSACCHARIDE VACCINE AGE 66-64 HIGH RISK  02/01/1964  . FOOT EXAM  02/01/1972  . OPHTHALMOLOGY EXAM  02/01/1972  . URINE MICROALBUMIN  02/01/1972  . TETANUS/TDAP  01/31/1981  . MAMMOGRAM  02/01/2012  . COLONOSCOPY  04/05/2015  . INFLUENZA VACCINE  09/14/2017   Depression screen PHQ 2/9 06/09/2017  Decreased Interest 0  Down, Depressed, Hopeless 0  PHQ - 2 Score 0  Altered sleeping 0  Tired, decreased energy 0  Change in appetite 0  Feeling bad or failure about yourself  0  Trouble concentrating 0  Moving slowly or fidgety/restless 0  Suicidal thoughts 0  PHQ-9 Score 0  Difficult doing work/chores Not difficult at all   PMHx, SurgHx, SocialHx, FamHx, Medications, and Allergies were reviewed in the Visit Navigator and updated as appropriate.   Patient Active Problem List   Diagnosis Date Noted  . History of bariatric surgery, gastric sleeve 12/13/2017  . Mild intermittent asthma without complication 33/29/5188  . Nocturnal leg cramps 12/13/2017  . Dieting, Ketogenic 12/13/2017  . Hypertension associated with diabetes ([place]) 12/13/2017  . Type 2 diabetes mellitus without complication, without long-term current use of insulin ([place]) 12/13/2017  . History of cholecystectomy 12/10/2017  . Kidney stones 09/08/2014  . Genetic testing 08/08/2014  . Family history of breast cancer   . Family history of ovarian cancer   . Family history of pancreatic cancer   . Family history of colon cancer   . Colon polyps   . History of laparoscopic partial gastrectomy 02/17/2011  . Morbid obesity ([place]) 01/19/2011   Social History   Tobacco Use  . Smoking status: Former Smoker    Last attempt to quit: 02/14/2009    Years since quitting: 8.8  .  Smokeless tobacco: Never Used  Substance Use Topics  . Alcohol use: No  . Drug use: No   Current Medications and Allergies:   .  None   Allergies  Allergen Reactions  . Vicodin [Hydrocodone-Acetaminophen] Itching   Review of Systems   Pertinent items are noted in the HPI. Otherwise, ROS is negative.  Vitals:   Vitals:   12/11/17 0659  BP: 122/76  Pulse: 61  Temp: 97.7 F (36.5 C)  TempSrc: Oral  SpO2: 98%  Weight: 201 lb 9.6 oz (91.4 kg)  Height: 5\' 8"  (1.727 m)     Body mass index is 30.65 kg/m.  Physical Exam:   Physical Exam  Constitutional: She appears well-nourished.  HENT:  Head: Normocephalic and atraumatic.  Eyes: Pupils are equal, round, and reactive to light. EOM are normal.  Neck: Normal range of motion. Neck supple.  Cardiovascular: Normal rate, regular rhythm, normal heart sounds and intact distal pulses.  Pulmonary/Chest: Effort normal.  Abdominal: Soft.  Skin: Skin is warm.  Psychiatric: She has a normal mood and affect. Her behavior is normal.  Nursing note and vitals reviewed.  Diabetic Foot Exam - Simple   Simple Foot Form Diabetic Foot exam was performed with the following findings:  Yes 12/13/2017  7:17 PM  Visual Inspection No deformities, no ulcerations, no other skin breakdown bilaterally:  Yes Sensation Testing Intact to touch and monofilament testing bilaterally:  Yes Pulse Check Posterior Tibialis and Dorsalis pulse intact bilaterally:  Yes Comments     Assessment and Plan:   Megan Petty was seen today for follow-up.  Diagnoses and all orders for this visit:  Type 2 diabetes mellitus without complication, without long-term current use of insulin ([place]) Comments: Resolved.   History of cholecystectomy  Polyp of colon, unspecified part of colon, unspecified type  Skin tear of forearm without complication, right, initial encounter Comments: Looks like it healing fairly well. Small amount of erythema surrounding the wound.  Rx mupirocin and precuations reviewed. Orders: -     mupirocin ointment (BACTROBAN) 2 %; Place 1 application into the nose 2 (two) times daily.  Hypertension associated with diabetes ([place]) Comments: Resolved.   Dieting, Ketogenic Comments: Doing well. No concerns.   Nocturnal leg cramps Comments: See AVS.   Mild intermittent asthma without complication  History of bariatric surgery, gastric sleeve Comments: With complication of ? hernia. Dr. Theda Sers said not good candidate for bypass to fix.   Screening for colon cancer -     Ambulatory referral to Gastroenterology    . Reviewed expectations re: course of current medical issues. . Discussed self-management of symptoms. . Outlined signs and symptoms indicating need for more acute intervention. . Patient verbalized understanding and all questions were answered. Marland Kitchen Health Maintenance issues including appropriate healthy diet, exercise, and smoking avoidance were discussed with patient. . See orders for this visit as documented in the electronic medical record. . Patient received an After Visit Summary.  Megan Deutscher, DO [place] [place] 12/13/2017

## 2017-12-11 ENCOUNTER — Ambulatory Visit (INDEPENDENT_AMBULATORY_CARE_PROVIDER_SITE_OTHER): Payer: 59 | Admitting: Family Medicine

## 2017-12-11 ENCOUNTER — Encounter: Payer: Self-pay | Admitting: Family Medicine

## 2017-12-11 VITALS — BP 122/76 | HR 61 | Temp 97.7°F | Ht 68.0 in | Wt 201.6 lb

## 2017-12-11 DIAGNOSIS — E119 Type 2 diabetes mellitus without complications: Secondary | ICD-10-CM | POA: Diagnosis not present

## 2017-12-11 DIAGNOSIS — J4520 Mild intermittent asthma, uncomplicated: Secondary | ICD-10-CM

## 2017-12-11 DIAGNOSIS — I152 Hypertension secondary to endocrine disorders: Secondary | ICD-10-CM

## 2017-12-11 DIAGNOSIS — E1159 Type 2 diabetes mellitus with other circulatory complications: Secondary | ICD-10-CM

## 2017-12-11 DIAGNOSIS — K635 Polyp of colon: Secondary | ICD-10-CM | POA: Diagnosis not present

## 2017-12-11 DIAGNOSIS — Z9884 Bariatric surgery status: Secondary | ICD-10-CM

## 2017-12-11 DIAGNOSIS — Z1211 Encounter for screening for malignant neoplasm of colon: Secondary | ICD-10-CM

## 2017-12-11 DIAGNOSIS — I10 Essential (primary) hypertension: Secondary | ICD-10-CM

## 2017-12-11 DIAGNOSIS — S51811A Laceration without foreign body of right forearm, initial encounter: Secondary | ICD-10-CM | POA: Diagnosis not present

## 2017-12-11 DIAGNOSIS — G4762 Sleep related leg cramps: Secondary | ICD-10-CM

## 2017-12-11 DIAGNOSIS — Z9049 Acquired absence of other specified parts of digestive tract: Secondary | ICD-10-CM

## 2017-12-11 DIAGNOSIS — Z789 Other specified health status: Secondary | ICD-10-CM

## 2017-12-11 MED ORDER — MUPIROCIN 2 % EX OINT: 1.0000 "application " | TOPICAL_OINTMENT | Freq: Two times a day (BID) | CUTANEOUS | 0 refills | Status: DC

## 2017-12-11 NOTE — Patient Instructions (Signed)
TRY TONIC WATER AND MAGNESIUM (400 MG) AT NIGHT FOR YOUR LEG CRAMPS. TAKE VITAMIN D 5000 IU DAILY.  KEEP UP THE GREAT WORK WITH YOUR DIET! I'LL SEE YOU AGAIN IN 6 MONTHS FOR A PHYSICAL. EMAIL THROUGH MYCHART IF YOU NEED ME!

## 2017-12-12 ENCOUNTER — Encounter: Payer: Self-pay | Admitting: Internal Medicine

## 2017-12-13 DIAGNOSIS — G4762 Sleep related leg cramps: Secondary | ICD-10-CM | POA: Insufficient documentation

## 2017-12-13 DIAGNOSIS — I152 Hypertension secondary to endocrine disorders: Secondary | ICD-10-CM | POA: Insufficient documentation

## 2017-12-13 DIAGNOSIS — J4520 Mild intermittent asthma, uncomplicated: Secondary | ICD-10-CM | POA: Insufficient documentation

## 2017-12-13 DIAGNOSIS — I10 Essential (primary) hypertension: Secondary | ICD-10-CM

## 2017-12-13 DIAGNOSIS — Z9884 Bariatric surgery status: Secondary | ICD-10-CM | POA: Insufficient documentation

## 2017-12-13 DIAGNOSIS — E119 Type 2 diabetes mellitus without complications: Secondary | ICD-10-CM | POA: Insufficient documentation

## 2017-12-13 DIAGNOSIS — Z789 Other specified health status: Secondary | ICD-10-CM | POA: Insufficient documentation

## 2017-12-13 DIAGNOSIS — E1159 Type 2 diabetes mellitus with other circulatory complications: Secondary | ICD-10-CM | POA: Insufficient documentation

## 2018-01-18 ENCOUNTER — Encounter: Payer: Self-pay | Admitting: Internal Medicine

## 2018-01-18 ENCOUNTER — Ambulatory Visit (AMBULATORY_SURGERY_CENTER): Payer: Self-pay

## 2018-01-18 VITALS — Ht 68.5 in | Wt 205.4 lb

## 2018-01-18 DIAGNOSIS — Z800 Family history of malignant neoplasm of digestive organs: Secondary | ICD-10-CM

## 2018-01-18 MED ORDER — NA SULFATE-K SULFATE-MG SULF 17.5-3.13-1.6 GM/177ML PO SOLN: 1.0000 | Freq: Once | ORAL | 0 refills | Status: AC

## 2018-01-18 NOTE — Progress Notes (Signed)
Denies allergies to eggs or soy products. Denies complication of anesthesia or sedation. Denies use of weight loss medication. Denies use of O2.   Emmi instructions declined.   Patient was given instructions for a two day Suprep and Miralax. Patient was very concerned about the volume that she had to drink due to her Gastric Sleeve Surgery. I discussed instructions with Robbin and told the patient that it was ok to take longer drinking the prep. Patient was informed that the water is very important in drinking the prep. Patient verbalizes understanding.

## 2018-01-29 ENCOUNTER — Telehealth: Payer: Self-pay | Admitting: Internal Medicine

## 2018-01-29 DIAGNOSIS — Z800 Family history of malignant neoplasm of digestive organs: Secondary | ICD-10-CM

## 2018-01-29 MED ORDER — NA SULFATE-K SULFATE-MG SULF 17.5-3.13-1.6 GM/177ML PO SOLN: ORAL | 0 refills | Status: DC

## 2018-01-29 NOTE — Telephone Encounter (Signed)
Pt called and advised that she may have thrown away the liquid for the colon that is scheduled for thur and need some called in

## 2018-01-29 NOTE — Telephone Encounter (Signed)
Patient thought we gave her the [place] at nurse visit, but it looks like it was sent in to the [facility] pharmacy. Patient will go get this today. Suprep rx resent today since it has been over 10 days since rx was sent. Pt aware.

## 2018-02-01 ENCOUNTER — Other Ambulatory Visit: Payer: Self-pay

## 2018-02-01 ENCOUNTER — Ambulatory Visit (AMBULATORY_SURGERY_CENTER): Payer: 59 | Admitting: Internal Medicine

## 2018-02-01 ENCOUNTER — Encounter: Payer: Self-pay | Admitting: Internal Medicine

## 2018-02-01 VITALS — BP 115/70 | HR 59 | Temp 97.8°F | Resp 10 | Ht 68.0 in | Wt 205.0 lb

## 2018-02-01 DIAGNOSIS — D122 Benign neoplasm of ascending colon: Secondary | ICD-10-CM | POA: Diagnosis not present

## 2018-02-01 DIAGNOSIS — D124 Benign neoplasm of descending colon: Secondary | ICD-10-CM | POA: Diagnosis not present

## 2018-02-01 DIAGNOSIS — Z86010 Personal history of colonic polyps: Secondary | ICD-10-CM | POA: Diagnosis not present

## 2018-02-01 DIAGNOSIS — Z800 Family history of malignant neoplasm of digestive organs: Secondary | ICD-10-CM

## 2018-02-01 DIAGNOSIS — D123 Benign neoplasm of transverse colon: Secondary | ICD-10-CM

## 2018-02-01 MED ORDER — SODIUM CHLORIDE 0.9 % IV SOLN: 500.0000 mL | Freq: Once | INTRAVENOUS | Status: DC

## 2018-02-01 NOTE — Progress Notes (Signed)
A and O x3. Report to RN. Tolerated MAC anesthesia well.

## 2018-02-01 NOTE — Progress Notes (Signed)
Pt's states no medical or surgical changes since previsit or office visit. 

## 2018-02-01 NOTE — Progress Notes (Signed)
Called to room to assist during endoscopic procedure.  Patient ID and intended procedure confirmed with present staff. Received instructions for my participation in the procedure from the performing physician.  

## 2018-02-01 NOTE — Op Note (Signed)
[facility] Patient Name: Megan Petty Procedure Date: 02/01/2018 9:10 AM MRN: [id number] Endoscopist: Jerene Bears , MD Age: 57 Referring MD:  Date of Birth: [birth year] Gender: Female Account #: [id number] Procedure:                Colonoscopy Indications:              Surveillance: Personal history of adenomatous                            polyps on last colonoscopy 5 years ago, Family                            history of colon cancer in a first-degree relative Medicines:                Monitored Anesthesia Care Procedure:                Pre-Anesthesia Assessment:                           - Prior to the procedure, a History and Physical                            was performed, and patient medications and                            allergies were reviewed. The patient's tolerance of                            previous anesthesia was also reviewed. The risks                            and benefits of the procedure and the sedation                            options and risks were discussed with the patient.                            All questions were answered, and informed consent                            was obtained. Prior Anticoagulants: The patient has                            taken no previous anticoagulant or antiplatelet                            agents. ASA Grade Assessment: II - A patient with                            mild systemic disease. After reviewing the risks                            and benefits, the patient was deemed in  satisfactory condition to undergo the procedure.                           After obtaining informed consent, the colonoscope                            was passed under direct vision. Throughout the                            procedure, the patient's blood pressure, pulse, and                            oxygen saturations were monitored continuously. The                            Model PCF-H190DL  [id number]) scope was introduced                            through the anus and advanced to the cecum,                            identified by appendiceal orifice and ileocecal                            valve. The colonoscopy was performed without                            difficulty. The patient tolerated the procedure                            well. The quality of the bowel preparation was                            good. The ileocecal valve, appendiceal orifice, and                            rectum were photographed. The bowel preparation                            used was 2 day MiraLax + Suprep. Scope In: 9:17:25 AM Scope Out: 9:34:08 AM Scope Withdrawal Time: 0 hours 13 minutes 12 seconds  Total Procedure Duration: 0 hours 16 minutes 43 seconds  Findings:                 Skin tags were found on perianal exam.                           A 3 mm polyp was found in the ascending colon. The                            polyp was sessile. The polyp was removed with a                            cold snare. Resection and retrieval  were complete.                           A 4 mm polyp was found in the transverse colon. The                            polyp was sessile. The polyp was removed with a                            cold snare. Resection and retrieval were complete.                           Two sessile polyps were found in the descending                            colon. The polyps were 4 to 5 mm in size. These                            polyps were removed with a cold snare. Resection                            and retrieval were complete.                           Multiple small and large-mouthed diverticula were                            found in the sigmoid colon.                           Internal hemorrhoids were found during                            retroflexion. The hemorrhoids were small. Complications:            No immediate complications. Estimated Blood Loss:      Estimated blood loss: none. Impression:               - Perianal skin tags found on perianal exam.                           - One 3 mm polyp in the ascending colon, removed                            with a cold snare. Resected and retrieved.                           - One 4 mm polyp in the transverse colon, removed                            with a cold snare. Resected and retrieved.                           - Two 4 to 5 mm polyps in the descending  colon,                            removed with a cold snare. Resected and retrieved.                           - Diverticulosis in the sigmoid colon.                           - Internal hemorrhoids. Recommendation:           - Patient has a contact number available for                            emergencies. The signs and symptoms of potential                            delayed complications were discussed with the                            patient. Return to normal activities tomorrow.                            Written discharge instructions were provided to the                            patient.                           - Resume previous diet.                           - Continue present medications.                           - Await pathology results.                           - Repeat colonoscopy is recommended for                            surveillance. The colonoscopy date will be                            determined after pathology results from today's                            exam become available for review. Jerene Bears, MD 02/01/2018 9:37:07 AM This report has been signed electronically.

## 2018-02-01 NOTE — Patient Instructions (Signed)
YOU HAD AN ENDOSCOPIC PROCEDURE TODAY AT THE [facility]:   Refer to the procedure report that was given to you for any specific questions about what was found during the examination.  If the procedure report does not answer your questions, please call your gastroenterologist to clarify.  If you requested that your care partner not be given the details of your procedure findings, then the procedure report has been included in a sealed envelope for you to review at your convenience later.  YOU SHOULD EXPECT: Some feelings of bloating in the abdomen. Passage of more gas than usual.  Walking can help get rid of the air that was put into your GI tract during the procedure and reduce the bloating. If you had a lower endoscopy (such as a colonoscopy or flexible sigmoidoscopy) you may notice spotting of blood in your stool or on the toilet paper. If you underwent a bowel prep for your procedure, you may not have a normal bowel movement for a few days.  Please Note:  You might notice some irritation and congestion in your nose or some drainage.  This is from the oxygen used during your procedure.  There is no need for concern and it should clear up in a day or so.  SYMPTOMS TO REPORT IMMEDIATELY:   Following lower endoscopy (colonoscopy or flexible sigmoidoscopy):  Excessive amounts of blood in the stool  Significant tenderness or worsening of abdominal pains  Swelling of the abdomen that is new, acute  Fever of 100F or higher  For urgent or emergent issues, a gastroenterologist can be reached at any hour by calling [phone].   DIET:  We do recommend a small meal at first, but then you may proceed to your regular diet.  Drink plenty of fluids but you should avoid alcoholic beverages for 24 hours.  ACTIVITY:  You should plan to take it easy for the rest of today and you should NOT DRIVE or use heavy machinery until tomorrow (because of the sedation medicines used during the test).     FOLLOW UP: Our staff will call the number listed on your records the next business day following your procedure to check on you and address any questions or concerns that you may have regarding the information given to you following your procedure. If we do not reach you, we will leave a message.  However, if you are feeling well and you are not experiencing any problems, there is no need to return our call.  We will assume that you have returned to your regular daily activities without incident.  If any biopsies were taken you will be contacted by phone or by letter within the next 1-3 weeks.  Please call us at [phone] if you have not heard about the biopsies in 3 weeks.   Await for biopsy results Polyps (handout given) Diverticulosis (handout given) Hemorrhoids (handout given)   SIGNATURES/CONFIDENTIALITY: You and/or your care partner have signed paperwork which will be entered into your electronic medical record.  These signatures attest to the fact that that the information above on your After Visit Summary has been reviewed and is understood.  Full responsibility of the confidentiality of this discharge information lies with you and/or your care-partner. 

## 2018-02-02 ENCOUNTER — Telehealth: Payer: Self-pay

## 2018-02-02 NOTE — Telephone Encounter (Signed)
  Follow up Call-  Call back number 02/01/2018  Post procedure Call Back phone  # [phone]  Permission to leave phone message Yes  Some recent data might be hidden     Patient questions:  Do you have a fever, pain , or abdominal swelling? No. Pain Score  0 *  Have you tolerated food without any problems? Yes.    Have you been able to return to your normal activities? Yes.    Do you have any questions about your discharge instructions: Diet   No. Medications  No. Follow up visit  No.  Do you have questions or concerns about your Care? No.  Actions: * If pain score is 4 or above: No action needed, pain <4.

## 2018-02-08 ENCOUNTER — Encounter: Payer: Self-pay | Admitting: Internal Medicine

## 2018-04-04 ENCOUNTER — Encounter: Payer: Self-pay | Admitting: Physician Assistant

## 2018-04-04 ENCOUNTER — Ambulatory Visit (INDEPENDENT_AMBULATORY_CARE_PROVIDER_SITE_OTHER): Payer: 59 | Admitting: Physician Assistant

## 2018-04-04 VITALS — BP 110/70 | HR 69 | Temp 98.2°F | Ht 68.0 in | Wt 202.5 lb

## 2018-04-04 DIAGNOSIS — H6123 Impacted cerumen, bilateral: Secondary | ICD-10-CM

## 2018-04-04 MED ORDER — OFLOXACIN 0.3 % OT SOLN: 5.0000 [drp] | Freq: Every day | OTIC | 0 refills | Status: DC

## 2018-04-04 NOTE — Patient Instructions (Addendum)
It was great to see you!  Use the ear drop for a few days. If you have worsening pain or any other symptoms, please let us know.  Take care,  Inda Coke PA-C

## 2018-04-04 NOTE — Progress Notes (Signed)
Megan Petty is a 58 y.o. female here for a new problem.  I acted as a Education administrator for [employer], PA-C Anselmo Pickler, LPN  History of Present Illness:   Chief Complaint  Patient presents with  . Otalgia    Otalgia   There is pain in the right ear. This is a new problem. Episode onset: Started on Saturday, pt was sick over the weekend but is feeling better except her right ear. The problem occurs every few hours. The problem has been gradually worsening. There has been no fever (Had fever over the weekend, 101-102). The pain is at a severity of 7/10. The pain is moderate. Associated symptoms include coughing (over the weekend). Pertinent negatives include no diarrhea, ear discharge, headaches, hearing loss, sore throat or vomiting. She has tried acetaminophen for the symptoms. The treatment provided moderate relief. There is no history of a chronic ear infection, hearing loss or a tympanostomy tube.    Past Medical History:  Diagnosis Date  . Allergy   . Asthma    seasonal  . Cancer ([place])    melanoma- left leg  . Colon polyps   . Diabetes mellitus without complication (HCC)    hx, not any longer  . Family history of breast cancer   . Family history of colon cancer   . Family history of ovarian cancer   . Family history of pancreatic cancer   . GERD (gastroesophageal reflux disease)   . H/O hiatal hernia    had surgery to fix with last surgery  . Hyperlipidemia   . Hypertension      Social History   Socioeconomic History  . Marital status: Married    Spouse name: Not on file  . Number of children: 2  . Years of education: Not on file  . Highest education level: Not on file  Occupational History  . Not on file  Social Needs  . Financial resource strain: Not on file  . Food insecurity:    Worry: Not on file    Inability: Not on file  . Transportation needs:    Medical: Not on file    Non-medical: Not on file  Tobacco Use  . Smoking status: Former Smoker     Last attempt to quit: 02/14/2009    Years since quitting: 9.1  . Smokeless tobacco: Never Used  Substance and Sexual Activity  . Alcohol use: No  . Drug use: No  . Sexual activity: Not on file  Lifestyle  . Physical activity:    Days per week: Not on file    Minutes per session: Not on file  . Stress: Not on file  Relationships  . Social connections:    Talks on phone: Not on file    Gets together: Not on file    Attends religious service: Not on file    Active member of club or organization: Not on file    Attends meetings of clubs or organizations: Not on file    Relationship status: Not on file  . Intimate partner violence:    Fear of current or ex partner: Not on file    Emotionally abused: Not on file    Physically abused: Not on file    Forced sexual activity: Not on file  Other Topics Concern  . Not on file  Social History Narrative  . Not on file    Past Surgical History:  Procedure Laterality Date  . ABDOMINAL HYSTERECTOMY    . ABDOMINOPLASTY/PANNICULECTOMY WITH  LIPOSUCTION N/A 12/31/2012   Procedure: PANNICULECTOMY WITH LIPOSUCTION;  Surgeon: Cristine Polio, MD;  Location: [facility];  Service: Plastics;  Laterality: N/A;  . CESAREAN SECTION    . CHOLECYSTECTOMY N/A 11/03/2017   Procedure: LAPAROSCOPIC CHOLECYSTECTOMY;  Surgeon: Clovis Riley, MD;  Location: [facility];  Service: General;  Laterality: N/A;  . COLONOSCOPY    . HERNIA REPAIR    . LAPAROSCOPIC GASTRIC SLEEVE RESECTION    . LITHOTRIPSY    . MELANOMA EXCISION    . TONSILLECTOMY      Family History  Problem Relation Age of Onset  . Colon cancer Mother        gallbladder cancer spread to colon  . Cancer Mother 83       gallbladder  . Lung cancer Father 51  . Pancreatic cancer Brother 78       Maternal 1/2 brother  . Breast cancer Paternal Aunt 51  . Kidney cancer Maternal Grandmother   . Lung cancer Maternal Grandfather   . Breast cancer Paternal Grandmother        dx in her 68s  . Ovarian  cancer Paternal Grandmother        dx in her 97s  . Esophageal cancer Neg Hx   . Rectal cancer Neg Hx   . Stomach cancer Neg Hx     Allergies  Allergen Reactions  . Vicodin [Hydrocodone-Acetaminophen] Itching    Current Medications:   Current Outpatient Medications:  .  ofloxacin (FLOXIN OTIC) 0.3 % OTIC solution, Place 5 drops into the right ear daily., Disp: 5 mL, Rfl: 0   Review of Systems:   Review of Systems  HENT: Positive for ear pain. Negative for ear discharge, hearing loss and sore throat.   Respiratory: Positive for cough (over the weekend).   Gastrointestinal: Negative for diarrhea and vomiting.  Neurological: Negative for headaches.    Vitals:   Vitals:   04/04/18 1135  BP: 110/70  Pulse: 69  Temp: 98.2 F (36.8 C)  TempSrc: Oral  SpO2: 97%  Weight: 202 lb 8 oz (91.9 kg)  Height: 5\' 8"  (1.727 m)     Body mass index is 30.79 kg/m.  Physical Exam:   Physical Exam Vitals signs and nursing note reviewed.  Constitutional:      General: She is not in acute distress.    Appearance: She is well-developed. She is not ill-appearing or toxic-appearing.  HENT:     Head: Normocephalic and atraumatic.     Right Ear: External ear normal. Decreased hearing noted. Tenderness present. There is impacted cerumen.     Left Ear: Ear canal and external ear normal. There is impacted cerumen.     Ears:     Comments: Erythematous, tender abrasion to R ear canal    Nose: Nose normal.     Right Sinus: No maxillary sinus tenderness or frontal sinus tenderness.     Left Sinus: No maxillary sinus tenderness or frontal sinus tenderness.     Mouth/Throat:     Pharynx: Uvula midline. No posterior oropharyngeal erythema.  Eyes:     General: Lids are normal.     Conjunctiva/sclera: Conjunctivae normal.  Neck:     Trachea: Trachea normal.  Cardiovascular:     Rate and Rhythm: Normal rate and regular rhythm.     Heart sounds: Normal heart sounds, S1 normal and S2 normal.   Pulmonary:     Effort: Pulmonary effort is normal.     Breath sounds: Normal breath sounds.  No decreased breath sounds, wheezing, rhonchi or rales.  Lymphadenopathy:     Cervical: No cervical adenopathy.  Skin:    General: Skin is warm and dry.  Neurological:     Mental Status: She is alert.  Psychiatric:        Speech: Speech normal.        Behavior: Behavior normal. Behavior is cooperative.    Procedure: Cerumen Disimpaction Warm water was applied and gentle ear lavage performed bilaterally. There were no complications and following the disimpaction the tympanic membrane was visible on both sides. Tympanic membranes are intact following the procedure.  R auditory canal with inflammation, L ear canal without inflammation. The patient reported significant relief of symptoms after removal of cerumen.    Assessment and Plan:   Galileah was seen today for otalgia.  Diagnoses and all orders for this visit:  Bilateral hearing loss due to cerumen impaction -     Ear wax removal  Other orders -     ofloxacin (FLOXIN OTIC) 0.3 % OTIC solution; Place 5 drops into the right ear daily.   No red flags on exam.  Improvement in perceived hearing and pain after procedure. Does have small area of irritation to R canal -- will have patient use the ofloxacin solution for a few days -- return precautions advised. Discussed taking medications as prescribed. Reviewed return precautions including worsening fever, SOB, worsening cough or other concerns. Push fluids and rest. I recommend that patient follow-up if symptoms worsen or persist despite treatment x 7-10 days, sooner if needed.  . Reviewed expectations re: course of current medical issues. . Discussed self-management of symptoms. . Outlined signs and symptoms indicating need for more acute intervention. . Patient verbalized understanding and all questions were answered. . See orders for this visit as documented in the electronic medical  record. . Patient received an After-Visit Summary.  CMA or LPN served as scribe during this visit. History, Physical, and Plan performed by medical provider. The above documentation has been reviewed and is accurate and complete.  Inda Coke, PA-C

## 2018-06-08 ENCOUNTER — Telehealth: Payer: Self-pay | Admitting: Family Medicine

## 2018-06-08 NOTE — Telephone Encounter (Signed)
Pt had questions about having leg cramps. Pt said she is on keto and stated she has been taking potassium and magnesium and would like to know if this is ok. Please advise.

## 2018-06-08 NOTE — Telephone Encounter (Signed)
Set up virtual visit 

## 2018-06-08 NOTE — Telephone Encounter (Signed)
Dr. Juleen China, please advise.

## 2018-06-11 ENCOUNTER — Ambulatory Visit (INDEPENDENT_AMBULATORY_CARE_PROVIDER_SITE_OTHER): Payer: 59 | Admitting: Family Medicine

## 2018-06-11 ENCOUNTER — Encounter: Payer: Self-pay | Admitting: Family Medicine

## 2018-06-11 VITALS — Ht 68.0 in | Wt 196.0 lb

## 2018-06-11 DIAGNOSIS — R252 Cramp and spasm: Secondary | ICD-10-CM | POA: Diagnosis not present

## 2018-06-11 DIAGNOSIS — Z7189 Other specified counseling: Secondary | ICD-10-CM

## 2018-06-11 DIAGNOSIS — Z789 Other specified health status: Secondary | ICD-10-CM | POA: Diagnosis not present

## 2018-06-11 DIAGNOSIS — E119 Type 2 diabetes mellitus without complications: Secondary | ICD-10-CM

## 2018-06-11 DIAGNOSIS — I10 Essential (primary) hypertension: Secondary | ICD-10-CM

## 2018-06-11 DIAGNOSIS — Z9884 Bariatric surgery status: Secondary | ICD-10-CM

## 2018-06-11 DIAGNOSIS — I152 Hypertension secondary to endocrine disorders: Secondary | ICD-10-CM

## 2018-06-11 DIAGNOSIS — E1159 Type 2 diabetes mellitus with other circulatory complications: Secondary | ICD-10-CM

## 2018-06-11 MED ORDER — GABAPENTIN 100 MG PO CAPS: 100.0000 mg | ORAL_CAPSULE | Freq: Every day | ORAL | 3 refills | Status: DC

## 2018-06-11 MED ORDER — BARIATRIC MULTIVITAMINS/IRON PO CAPS: 1.0000 | ORAL_CAPSULE | Freq: Every day | ORAL | 0 refills | Status: DC

## 2018-06-11 NOTE — Patient Instructions (Signed)

## 2018-06-11 NOTE — Progress Notes (Signed)
Virtual Visit via Video   Due to the COVID-19 pandemic, this visit was completed with telemedicine (audio/video) technology to reduce patient and provider exposure as well as to preserve personal protective equipment.   I connected with Megan Petty by a video enabled telemedicine application and verified that I am speaking with the correct person using two identifiers. Location patient: Home Location provider: [facility], Office Persons participating in the virtual visit: Aila, Terra, DO   I discussed the limitations of evaluation and management by telemedicine and the availability of in person appointments. The patient expressed understanding and agreed to proceed.  Care Team   Patient Care Team: Briscoe Deutscher, DO as PCP - General (Family Medicine) Arvella Nigh, MD as Consulting Physician (Obstetrics and Gynecology)  Subjective:   HPI:   Chief Complaint  Patient presents with  . Muscle cramping    In hands and legs. Worse at night. Has been taking Potassium and Magensium x 1 week, has provided some relief. Last week she woke up every night with pain. She started taking magnesium 250 and potassium 500 daily and has had some improvement. No change in her fluid intake or urine output. No diet changes. She has not tried water supplements. She will try some and see if she has improvement. She is only having issues with pain at night.   . Gastroesophageal Reflux    Pain after eating or drinking. Hx of hernia. Following Keto diet. No currently taking med for GERD.   Marland Kitchen Acute Visit   She is not taking bariatric multi vitamin. We will mail information to her so that she has our recommendation.   Reviewed all precautions and expectations with prevention of Covid-19. 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 She is staying away from granddaughter right now to keep from getting her sick.   Review of Systems  Constitutional: Negative for chills and fever.  HENT: Negative  for hearing loss and tinnitus.   Eyes: Negative for blurred vision and double vision.  Respiratory: Negative for cough and hemoptysis.   Cardiovascular: Negative for chest pain and palpitations.  Gastrointestinal: Positive for heartburn. Negative for nausea.  Genitourinary: Negative for dysuria and urgency.  Musculoskeletal: Positive for myalgias.  Skin: Negative for rash.  Neurological: Negative for dizziness and headaches.  Endo/Heme/Allergies: Does not bruise/bleed easily.  Psychiatric/Behavioral: Negative for depression.     Patient Active Problem List   Diagnosis Date Noted  . History of bariatric surgery, gastric sleeve 12/13/2017  . Mild intermittent asthma without complication 08/65/7846  . Nocturnal leg cramps 12/13/2017  . Dieting, Ketogenic 12/13/2017  . Hypertension associated with diabetes ([place]) 12/13/2017  . Type 2 diabetes mellitus without complication, without long-term current use of insulin ([place]) 12/13/2017  . History of cholecystectomy 12/10/2017  . Kidney stones 09/08/2014  . Genetic testing 08/08/2014  . Family history of breast cancer   . Family history of ovarian cancer   . Family history of pancreatic cancer   . Family history of colon cancer   . Colon polyps   . History of laparoscopic partial gastrectomy 02/17/2011  . Morbid obesity ([place]) 01/19/2011    Social History   Tobacco Use  . Smoking status: Former Smoker    Last attempt to quit: 02/14/2009    Years since quitting: 9.3  . Smokeless tobacco: Never Used  Substance Use Topics  . Alcohol use: No    Current Outpatient Medications:  .  gabapentin (NEURONTIN) 100 MG capsule, Take 1  capsule (100 mg total) by mouth at bedtime., Disp: 30 capsule, Rfl: 3 .  Multiple Vitamins-Minerals (BARIATRIC MULTIVITAMINS/IRON) CAPS, Take 1 capsule by mouth daily., Disp: 90 capsule, Rfl: 0  Allergies  Allergen Reactions  . Vicodin [Hydrocodone-Acetaminophen] Itching    Objective:   VITALS: Per patient if  applicable, see vitals. GENERAL: Alert, appears well and in no acute distress. HEENT: Atraumatic, conjunctiva clear, no obvious abnormalities on inspection of external nose and ears. NECK: Normal movements of the head and neck. CARDIOPULMONARY: No increased WOB. Speaking in clear sentences. I:E ratio WNL.  MS: Moves all visible extremities without noticeable abnormality. PSYCH: Pleasant and cooperative, well-groomed. Speech normal rate and rhythm. Affect is appropriate. Insight and judgement are appropriate. Attention is focused, linear, and appropriate.  NEURO: CN grossly intact. Oriented as arrived to appointment on time with no prompting. Moves both UE equally.  SKIN: No obvious lesions, wounds, erythema, or cyanosis noted on face or hands.  Depression screen [facility] 2/9 06/09/2017  Decreased Interest 0  Down, Depressed, Hopeless 0  PHQ - 2 Score 0  Altered sleeping 0  Tired, decreased energy 0  Change in appetite 0  Feeling bad or failure about yourself  0  Trouble concentrating 0  Moving slowly or fidgety/restless 0  Suicidal thoughts 0  PHQ-9 Score 0  Difficult doing work/chores Not difficult at all    Assessment and Plan:   [name] was seen today for muscle cramping, gastroesophageal reflux and acute visit.  Diagnoses and all orders for this visit:  Leg cramps -     gabapentin (NEURONTIN) 100 MG capsule; Take 1 capsule (100 mg total) by mouth at bedtime. -     CBC with Differential/Platelet; Future -     Comprehensive metabolic panel; Future -     TSH; Future -     Iron and TIBC; Future -     Magnesium; Future -     Phosphorus; Future  Educated About Covid-19 Virus Infection  Dieting, Ketogenic  History of bariatric surgery, gastric sleeve -     Multiple Vitamins-Minerals (BARIATRIC MULTIVITAMINS/IRON) CAPS; Take 1 capsule by mouth daily.  Hypertension associated with diabetes ([place])  Type 2 diabetes mellitus without complication, without long-term current use of  insulin ([place])    . COVID-19 Education: The signs and symptoms of COVID-19 were discussed with the patient and how to seek care for testing if needed. The importance of social distancing was discussed today. . Reviewed expectations re: course of current medical issues. . Discussed self-management of symptoms. . Outlined signs and symptoms indicating need for more acute intervention. . Patient verbalized understanding and all questions were answered. Marland Kitchen Health Maintenance issues including appropriate healthy diet, exercise, and smoking avoidance were discussed with patient. . See orders for this visit as documented in the electronic medical record.  Briscoe Deutscher, DO 06/16/2018  Records requested if needed. Time spent: 25 minutes, of which >50% was spent in obtaining information about her symptoms, reviewing her previous labs, evaluations, and treatments, counseling her about her condition (please see the discussed topics above), and developing a plan to further investigate it; she had a number of questions which I addressed.

## 2018-06-11 NOTE — Telephone Encounter (Signed)
Called pt and scheduled for today at 3:00 with Dr. Juleen China for virtual visit.

## 2018-06-12 ENCOUNTER — Other Ambulatory Visit: Payer: Self-pay

## 2018-06-12 ENCOUNTER — Other Ambulatory Visit (INDEPENDENT_AMBULATORY_CARE_PROVIDER_SITE_OTHER): Payer: 59

## 2018-06-12 DIAGNOSIS — R252 Cramp and spasm: Secondary | ICD-10-CM | POA: Diagnosis not present

## 2018-06-12 LAB — CBC WITH DIFFERENTIAL/PLATELET
Basophils Absolute: 0 10*3/uL (ref 0.0–0.1)
Basophils Relative: 1 % (ref 0.0–3.0)
Eosinophils Absolute: 0.1 10*3/uL (ref 0.0–0.7)
Eosinophils Relative: 3.5 % (ref 0.0–5.0)
HCT: 40.1 % (ref 36.0–46.0)
Hemoglobin: 13.6 g/dL (ref 12.0–15.0)
Lymphocytes Relative: 43.8 % (ref 12.0–46.0)
Lymphs Abs: 1.7 10*3/uL (ref 0.7–4.0)
MCHC: 33.8 g/dL (ref 30.0–36.0)
MCV: 89.7 fl (ref 78.0–100.0)
Monocytes Absolute: 0.4 10*3/uL (ref 0.1–1.0)
Monocytes Relative: 10.1 % (ref 3.0–12.0)
Neutro Abs: 1.6 10*3/uL (ref 1.4–7.7)
Neutrophils Relative %: 41.6 % — ABNORMAL LOW (ref 43.0–77.0)
Platelets: 178 10*3/uL (ref 150.0–400.0)
RBC: 4.47 Mil/uL (ref 3.87–5.11)
RDW: 13.8 % (ref 11.5–15.5)
WBC: 3.8 10*3/uL — ABNORMAL LOW (ref 4.0–10.5)

## 2018-06-12 LAB — COMPREHENSIVE METABOLIC PANEL
ALT: 11 U/L (ref 0–35)
AST: 12 U/L (ref 0–37)
Albumin: 3.8 g/dL (ref 3.5–5.2)
Alkaline Phosphatase: 63 U/L (ref 39–117)
BUN: 26 mg/dL — ABNORMAL HIGH (ref 6–23)
CO2: 29 mEq/L (ref 19–32)
Calcium: 8.4 mg/dL (ref 8.4–10.5)
Chloride: 107 mEq/L (ref 96–112)
Creatinine, Ser: 0.85 mg/dL (ref 0.40–1.20)
GFR: 69.09 mL/min (ref >60.00)
Glucose, Bld: 92 mg/dL (ref 70–99)
Potassium: 3.8 mEq/L (ref 3.5–5.1)
Sodium: 142 mEq/L (ref 135–145)
Total Bilirubin: 0.6 mg/dL (ref 0.2–1.2)
Total Protein: 6 g/dL (ref 6.0–8.3)

## 2018-06-12 LAB — PHOSPHORUS: Phosphorus: 3.8 mg/dL (ref 2.3–4.6)

## 2018-06-12 LAB — TSH: TSH: 2.35 u[IU]/mL (ref 0.35–4.50)

## 2018-06-12 LAB — MAGNESIUM: Magnesium: 1.9 mg/dL (ref 1.5–2.5)

## 2018-06-13 LAB — IRON, TOTAL/TOTAL IRON BINDING CAP
%SAT: 42 % (calc) (ref 16–45)
Iron: 125 ug/dL (ref 45–160)
TIBC: 301 mcg/dL (calc) (ref 250–450)

## 2018-06-15 ENCOUNTER — Encounter: Payer: 59 | Admitting: Family Medicine

## 2018-06-16 ENCOUNTER — Encounter: Payer: Self-pay | Admitting: Family Medicine

## 2018-06-19 ENCOUNTER — Other Ambulatory Visit: Payer: Self-pay | Admitting: *Deleted

## 2018-06-19 DIAGNOSIS — D72819 Decreased white blood cell count, unspecified: Secondary | ICD-10-CM

## 2018-06-21 ENCOUNTER — Telehealth: Payer: Self-pay | Admitting: Internal Medicine

## 2018-06-21 NOTE — Telephone Encounter (Signed)
Received a new hem referral from Dr. Juleen China for a dx of leukopenia. Pt has been cld and scheduled to see Dr. Walden Field on 5/22 at 850am. She needed early morning appts. Aware to arrive 15 minute early. Letter mailed.

## 2018-06-26 ENCOUNTER — Encounter: Payer: Self-pay | Admitting: Internal Medicine

## 2018-07-03 ENCOUNTER — Other Ambulatory Visit: Payer: Self-pay | Admitting: Family Medicine

## 2018-07-03 DIAGNOSIS — R252 Cramp and spasm: Secondary | ICD-10-CM

## 2018-07-06 ENCOUNTER — Inpatient Hospital Stay: Payer: 59

## 2018-07-06 ENCOUNTER — Other Ambulatory Visit: Payer: Self-pay

## 2018-07-06 ENCOUNTER — Inpatient Hospital Stay: Payer: 59 | Attending: Internal Medicine | Admitting: Internal Medicine

## 2018-07-06 VITALS — BP 110/78 | HR 74 | Temp 97.8°F | Resp 18 | Ht 68.0 in | Wt 202.4 lb

## 2018-07-06 DIAGNOSIS — D72818 Other decreased white blood cell count: Secondary | ICD-10-CM | POA: Diagnosis not present

## 2018-07-06 DIAGNOSIS — R102 Pelvic and perineal pain: Secondary | ICD-10-CM

## 2018-07-06 DIAGNOSIS — M25559 Pain in unspecified hip: Secondary | ICD-10-CM

## 2018-07-06 DIAGNOSIS — Z9884 Bariatric surgery status: Secondary | ICD-10-CM | POA: Insufficient documentation

## 2018-07-06 DIAGNOSIS — Z8249 Family history of ischemic heart disease and other diseases of the circulatory system: Secondary | ICD-10-CM

## 2018-07-06 DIAGNOSIS — M2550 Pain in unspecified joint: Secondary | ICD-10-CM | POA: Diagnosis not present

## 2018-07-06 DIAGNOSIS — Z8041 Family history of malignant neoplasm of ovary: Secondary | ICD-10-CM | POA: Insufficient documentation

## 2018-07-06 DIAGNOSIS — Z79899 Other long term (current) drug therapy: Secondary | ICD-10-CM | POA: Insufficient documentation

## 2018-07-06 DIAGNOSIS — Z87891 Personal history of nicotine dependence: Secondary | ICD-10-CM | POA: Insufficient documentation

## 2018-07-06 DIAGNOSIS — D72819 Decreased white blood cell count, unspecified: Secondary | ICD-10-CM | POA: Diagnosis not present

## 2018-07-06 DIAGNOSIS — E119 Type 2 diabetes mellitus without complications: Secondary | ICD-10-CM | POA: Diagnosis not present

## 2018-07-06 DIAGNOSIS — Z85820 Personal history of malignant melanoma of skin: Secondary | ICD-10-CM | POA: Insufficient documentation

## 2018-07-06 DIAGNOSIS — D539 Nutritional anemia, unspecified: Secondary | ICD-10-CM

## 2018-07-06 DIAGNOSIS — Z800 Family history of malignant neoplasm of digestive organs: Secondary | ICD-10-CM | POA: Diagnosis not present

## 2018-07-06 DIAGNOSIS — Z803 Family history of malignant neoplasm of breast: Secondary | ICD-10-CM | POA: Insufficient documentation

## 2018-07-06 DIAGNOSIS — I10 Essential (primary) hypertension: Secondary | ICD-10-CM | POA: Diagnosis not present

## 2018-07-06 LAB — CBC WITH DIFFERENTIAL (CANCER CENTER ONLY)
Abs Immature Granulocytes: 0.01 10*3/uL (ref 0.00–0.07)
Basophils Absolute: 0.1 10*3/uL (ref 0.0–0.1)
Basophils Relative: 1 %
Eosinophils Absolute: 0.1 10*3/uL (ref 0.0–0.5)
Eosinophils Relative: 3 %
HCT: 42.3 % (ref 36.0–46.0)
Hemoglobin: 13.8 g/dL (ref 12.0–15.0)
Immature Granulocytes: 0 %
Lymphocytes Relative: 37 %
Lymphs Abs: 1.5 10*3/uL (ref 0.7–4.0)
MCH: 29.5 pg (ref 26.0–34.0)
MCHC: 32.6 g/dL (ref 30.0–36.0)
MCV: 90.4 fL (ref 80.0–100.0)
Monocytes Absolute: 0.3 10*3/uL (ref 0.1–1.0)
Monocytes Relative: 8 %
Neutro Abs: 2 10*3/uL (ref 1.7–7.7)
Neutrophils Relative %: 51 %
Platelet Count: 203 10*3/uL (ref 150–400)
RBC: 4.68 MIL/uL (ref 3.87–5.11)
RDW: 12 % (ref 11.5–15.5)
WBC Count: 4.1 10*3/uL (ref 4.0–10.5)
nRBC: 0 % (ref 0.0–0.2)

## 2018-07-06 LAB — CMP (CANCER CENTER ONLY)
ALT: 11 U/L (ref 0–44)
AST: 14 U/L — ABNORMAL LOW (ref 15–41)
Albumin: 3.8 g/dL (ref 3.5–5.0)
Alkaline Phosphatase: 78 U/L (ref 38–126)
Anion gap: 8 (ref 5–15)
BUN: 20 mg/dL (ref 6–20)
CO2: 26 mmol/L (ref 22–32)
Calcium: 9 mg/dL (ref 8.9–10.3)
Chloride: 109 mmol/L (ref 98–111)
Creatinine: 0.87 mg/dL (ref 0.44–1.00)
GFR, Est AFR Am: >60 mL/min (ref >60)
GFR, Estimated: >60 mL/min (ref >60)
Glucose, Bld: 92 mg/dL (ref 70–99)
Potassium: 3.9 mmol/L (ref 3.5–5.1)
Sodium: 143 mmol/L (ref 135–145)
Total Bilirubin: 0.4 mg/dL (ref 0.3–1.2)
Total Protein: 6.7 g/dL (ref 6.5–8.1)

## 2018-07-06 LAB — IRON AND TIBC
Iron: 95 ug/dL (ref 41–142)
Saturation Ratios: 30 % (ref 21–57)
TIBC: 319 ug/dL (ref 236–444)
UIBC: 224 ug/dL (ref 120–384)

## 2018-07-06 LAB — SEDIMENTATION RATE: Sed Rate: 2 mm/hr (ref 0–22)

## 2018-07-06 LAB — LACTATE DEHYDROGENASE: LDH: 163 U/L (ref 98–192)

## 2018-07-06 LAB — VITAMIN B12: Vitamin B-12: 285 pg/mL (ref 180–914)

## 2018-07-06 LAB — FERRITIN: Ferritin: 76 ng/mL (ref 11–307)

## 2018-07-06 LAB — FOLATE: Folate: 7 ng/mL (ref >5.9)

## 2018-07-06 NOTE — Progress Notes (Signed)
Referring Physician:  Briscoe Deutscher, DO  Diagnosis Other decreased white blood cell (WBC) count - Plan: CBC with Differential ([place] Only), CMP ([place] only), Lactate dehydrogenase (LDH), Ferritin, Iron and TIBC, Sedimentation rate, SPEP with reflex to IFE, Folate, Serum, Vitamin B12, Methylmalonic acid, serum, Rheumatoid factor, ANA, IFA (with reflex)  Nutritional anemia - Plan: CBC with Differential (Cancer Center Only), CMP ([place] only), Lactate dehydrogenase (LDH), Ferritin, Iron and TIBC, Sedimentation rate, SPEP with reflex to IFE, Folate, Serum, Vitamin B12, Methylmalonic acid, serum, Rheumatoid factor, ANA, IFA (with reflex)  Pain in joint involving pelvic region and thigh, unspecified laterality - Plan: CBC with Differential ([place] Only), CMP ([place] only), Lactate dehydrogenase (LDH), Ferritin, Iron and TIBC, Sedimentation rate, SPEP with reflex to IFE, Folate, Serum, Vitamin B12, Methylmalonic acid, serum, Rheumatoid factor, ANA, IFA (with reflex)  Staging Cancer Staging No matching staging information was found for the patient.  Assessment and Plan:  1.  Leukopenia.  58 year old female referred for evaluation due to leukopenia.  Pt had labs done 06/12/2018 that showed WBC 3.8 HB 13.6 plts 178,000.  Normal differential.  Chemistries showed K+ 3.8 Cr 0.85 normal LFTs.  She denies any fevers, chills, night sweats and has noted no adenopathy.  Pt has occasional joint pain.  She had gastric bypass in 2012.  She is not currently on bariatric vitamins.  She had colonoscopy 02/01/2018 that showed:  - Perianal skin tags found on perianal exam. - One 3 mm polyp in the ascending colon, removed with a cold snare. Resected and retrieved. - One 4 mm polyp in the transverse colon, removed with a cold snare. Resected and retrieved. - Two 4 to 5 mm polyps in the descending colon, removed with a cold snare. Resected and retrieved. - Diverticulosis in the sigmoid  colon. - Internal hemorrhoids  Pathology returned as tubular adenomas with no evidence of malignancy or dysplasia.    She is doing Keto diet and reports she has lost weight and has improved energy.    Pt is seen today for consultation due to leukopenia.    Labs done today 07/06/2018 reviewed and showed WBC 4.1 HB 13.8 plts 203,000.  Chemistries showed K+ 3.9 Cr 0.87 and normal LFTs.  Pt reassured that counts WNL.  She will have phone visit in 2 weeks to go over remaining results.   2.  Joint pain.  Will check ANA, RF, Sed rate and SPEP.  Pt will have phone visit in 2 weeks to go over results.   3.  Gastric sleeve surgery.  Will check iron studies, B12, folate, MMA.  Will go over results via phone in 2 weeks.   4.  Family history of various cancers.  Pt reports she had genetic evaluation in the past.   5.  Health maintenance.  Pt had colonoscopy done 02/01/2018.  Follow-up with GI as recommended.  Mammogram screenings as recommended by PCP.   30 minutes spent with more than 50% spent in review of records, counseling and coordination of care.    HPI:  58 year old female referred for evaluation due to leukopenia.  Pt had labs done 06/12/2018 that showed WBC 3.8 HB 13.6 plts 178,000.  Normal differential.  Chemistries showed K+ 3.8 Cr 0.85 normal LFTs.  She denies any fevers, chills, night sweats and has noted no adenopathy.  Pt has occasional joint pain.  She had gastric bypass in 2012.  She is not currently on bariatric vitamins.  She had  colonoscopy 02/01/2018 that showed:  - Perianal skin tags found on perianal exam. - One 3 mm polyp in the ascending colon, removed with a cold snare. Resected and retrieved. - One 4 mm polyp in the transverse colon, removed with a cold snare. Resected and retrieved. - Two 4 to 5 mm polyps in the descending colon, removed with a cold snare. Resected and retrieved. - Diverticulosis in the sigmoid colon. - Internal hemorrhoids  Pathology returned as tubular  adenomas with no evidence of malignancy or dysplasia.    She is doing Keto diet and reports she has lost weight and has improved energy.    Pt is seen today for consultation due to leukopenia.    Problem List Patient Active Problem List   Diagnosis Date Noted  . History of bariatric surgery, gastric sleeve [Z98.84] 12/13/2017  . Mild intermittent asthma without complication [O17.51] 02/58/5277  . Nocturnal leg cramps [G47.62] 12/13/2017  . Dieting, Ketogenic [Z78.9] 12/13/2017  . Hypertension associated with diabetes ([place]) [E11.59, I10] 12/13/2017  . Type 2 diabetes mellitus without complication, without long-term current use of insulin ([place]) [E11.9] 12/13/2017  . History of cholecystectomy [Z90.49] 12/10/2017  . Kidney stones [N20.0] 09/08/2014  . Genetic testing [Z13.79] 08/08/2014  . Family history of breast cancer [Z80.3]   . Family history of ovarian cancer [Z80.41]   . Family history of pancreatic cancer [Z80.0]   . Family history of colon cancer [Z80.0]   . Colon polyps [K63.5]   . History of laparoscopic partial gastrectomy [Z90.3] 02/17/2011  . Morbid obesity ([place]) [E66.01] 01/19/2011    Past Medical History Past Medical History:  Diagnosis Date  . Allergy   . Asthma    seasonal  . Cancer ([place])    melanoma- left leg  . Colon polyps   . Diabetes mellitus without complication (HCC)    hx, not any longer  . Family history of breast cancer   . Family history of colon cancer   . Family history of ovarian cancer   . Family history of pancreatic cancer   . GERD (gastroesophageal reflux disease)   . H/O hiatal hernia    had surgery to fix with last surgery  . Hyperlipidemia   . Hypertension     Past Surgical History Past Surgical History:  Procedure Laterality Date  . ABDOMINAL HYSTERECTOMY    . ABDOMINOPLASTY/PANNICULECTOMY WITH LIPOSUCTION N/A 12/31/2012   Procedure: PANNICULECTOMY WITH LIPOSUCTION;  Surgeon: Cristine Polio, MD;  Location: [facility];  Service:  Plastics;  Laterality: N/A;  . CESAREAN SECTION    . CHOLECYSTECTOMY N/A 11/03/2017   Procedure: LAPAROSCOPIC CHOLECYSTECTOMY;  Surgeon: Clovis Riley, MD;  Location: [facility];  Service: General;  Laterality: N/A;  . COLONOSCOPY    . HERNIA REPAIR    . LAPAROSCOPIC GASTRIC SLEEVE RESECTION    . LITHOTRIPSY    . MELANOMA EXCISION    . TONSILLECTOMY      Family History Family History  Problem Relation Age of Onset  . Colon cancer Mother        gallbladder cancer spread to colon  . Cancer Mother 1       gallbladder  . Lung cancer Father 84  . Pancreatic cancer Brother 66       Maternal 1/2 brother  . Breast cancer Paternal Aunt 43  . Kidney cancer Maternal Grandmother   . Lung cancer Maternal Grandfather   . Breast cancer Paternal Grandmother        dx in her 62s  .  Ovarian cancer Paternal Grandmother        dx in her 28s  . Esophageal cancer Neg Hx   . Rectal cancer Neg Hx   . Stomach cancer Neg Hx      Social History  reports that she quit smoking about 9 years ago. She has never used smokeless tobacco. She reports that she does not drink alcohol or use drugs.  Medications  Current Outpatient Medications:  .  gabapentin (NEURONTIN) 100 MG capsule, TAKE 1 CAPSULE (100 MG TOTAL) BY MOUTH AT BEDTIME., Disp: 90 capsule, Rfl: 2 .  Multiple Vitamins-Minerals (BARIATRIC MULTIVITAMINS/IRON) CAPS, Take 1 capsule by mouth daily. (Patient not taking: Reported on 07/06/2018), Disp: 90 capsule, Rfl: 0  Allergies Vicodin [hydrocodone-acetaminophen]  Review of Systems Review of Systems - Oncology ROS negative other than joint pain   Physical Exam  Vitals Wt Readings from Last 3 Encounters:  07/06/18 202 lb 6.4 oz (91.8 kg)  06/11/18 196 lb (88.9 kg)  04/04/18 202 lb 8 oz (91.9 kg)   Temp Readings from Last 3 Encounters:  07/06/18 97.8 F (36.6 C) (Oral)  04/04/18 98.2 F (36.8 C) (Oral)  02/01/18 97.8 F (36.6 C)   BP Readings from Last 3 Encounters:  07/06/18  110/78  04/04/18 110/70  02/01/18 115/70   Pulse Readings from Last 3 Encounters:  07/06/18 74  04/04/18 69  02/01/18 (!) 59   Constitutional: Well-developed, well-nourished, and in no distress.   HENT: Head: Normocephalic and atraumatic.  Mouth/Throat: No oropharyngeal exudate. Mucosa moist. Eyes: Pupils are equal, round, and reactive to light. Conjunctivae are normal. No scleral icterus.  Neck: Normal range of motion. Neck supple. No JVD present.  Cardiovascular: Normal rate, regular rhythm and normal heart sounds.  Exam reveals no gallop and no friction rub.   No murmur heard. Pulmonary/Chest: Effort normal and breath sounds normal. No respiratory distress. No wheezes.No rales.  Abdominal: Soft. Bowel sounds are normal. No distension. There is no tenderness. There is no guarding.  Musculoskeletal: No edema or tenderness.  Lymphadenopathy: No cervical,axillary or supraclavicular adenopathy.  Neurological: Alert and oriented to person, place, and time. No cranial nerve deficit.  Skin: Skin is warm and dry. No rash noted. No erythema. No pallor.  Psychiatric: Affect and judgment normal.   Labs Appointment on 07/06/2018  Component Date Value Ref Range Status  . Iron 07/06/2018 95  41 - 142 ug/dL Final  . TIBC 07/06/2018 319  236 - 444 ug/dL Final  . Saturation Ratios 07/06/2018 30  21 - 57 % Final  . UIBC 07/06/2018 224  120 - 384 ug/dL Final   Performed at [facility], [place] [address], [place], [place] [address]  . LDH 07/06/2018 163  98 - 192 U/L Final   Performed at [facility], [place] [address], [place], [place] [address]  . Sodium 07/06/2018 143  135 - 145 mmol/L Final  . Potassium 07/06/2018 3.9  3.5 - 5.1 mmol/L Final  . Chloride 07/06/2018 109  98 - 111 mmol/L Final  . CO2 07/06/2018 26  22 - 32 mmol/L Final  . Glucose, Bld 07/06/2018 92  70 - 99 mg/dL Final  . BUN 07/06/2018 20  6 - 20 mg/dL Final  . Creatinine  07/06/2018 0.87  0.44 - 1.00 mg/dL Final  . Calcium 07/06/2018 9.0  8.9 - 10.3 mg/dL Final  . Total Protein 07/06/2018 6.7  6.5 - 8.1 g/dL Final  . Albumin 07/06/2018 3.8  3.5 -  5.0 g/dL Final  . AST 07/06/2018 14* 15 - 41 U/L Final  . ALT 07/06/2018 11  0 - 44 U/L Final  . Alkaline Phosphatase 07/06/2018 78  38 - 126 U/L Final  . Total Bilirubin 07/06/2018 0.4  0.3 - 1.2 mg/dL Final  . GFR, Est Non Af Am 07/06/2018 >60  >60 mL/min Final  . GFR, Est AFR Am 07/06/2018 >60  >60 mL/min Final  . Anion gap 07/06/2018 8  5 - 15 Final   Performed at [facility], [place] [address], [place], [place] [address]  . WBC Count 07/06/2018 4.1  4.0 - 10.5 K/uL Final  . RBC 07/06/2018 4.68  3.87 - 5.11 MIL/uL Final  . Hemoglobin 07/06/2018 13.8  12.0 - 15.0 g/dL Final  . HCT 07/06/2018 42.3  36.0 - 46.0 % Final  . MCV 07/06/2018 90.4  80.0 - 100.0 fL Final  . MCH 07/06/2018 29.5  26.0 - 34.0 pg Final  . MCHC 07/06/2018 32.6  30.0 - 36.0 g/dL Final  . RDW 07/06/2018 12.0  11.5 - 15.5 % Final  . Platelet Count 07/06/2018 203  150 - 400 K/uL Final  . nRBC 07/06/2018 0.0  0.0 - 0.2 % Final  . Neutrophils Relative % 07/06/2018 51  % Final  . Neutro Abs 07/06/2018 2.0  1.7 - 7.7 K/uL Final  . Lymphocytes Relative 07/06/2018 37  % Final  . Lymphs Abs 07/06/2018 1.5  0.7 - 4.0 K/uL Final  . Monocytes Relative 07/06/2018 8  % Final  . Monocytes Absolute 07/06/2018 0.3  0.1 - 1.0 K/uL Final  . Eosinophils Relative 07/06/2018 3  % Final  . Eosinophils Absolute 07/06/2018 0.1  0.0 - 0.5 K/uL Final  . Basophils Relative 07/06/2018 1  % Final  . Basophils Absolute 07/06/2018 0.1  0.0 - 0.1 K/uL Final  . Immature Granulocytes 07/06/2018 0  % Final  . Abs Immature Granulocytes 07/06/2018 0.01  0.00 - 0.07 K/uL Final   Performed at [facility], [place] [address] [place], [place] [address]     Pathology Orders Placed This Encounter  Procedures  . CBC with  Differential (Cancer Center Only)    Standing Status:   Future    Number of Occurrences:   1    Standing Expiration Date:   07/06/2019  . CMP ([place] only)    Standing Status:   Future    Number of Occurrences:   1    Standing Expiration Date:   07/06/2019  . Lactate dehydrogenase (LDH)    Standing Status:   Future    Number of Occurrences:   1    Standing Expiration Date:   07/06/2019  . Ferritin    Standing Status:   Future    Number of Occurrences:   1    Standing Expiration Date:   07/06/2019  . Iron and TIBC    Standing Status:   Future    Number of Occurrences:   1    Standing Expiration Date:   07/06/2019  . Sedimentation rate    Standing Status:   Future    Number of Occurrences:   1    Standing Expiration Date:   07/06/2019  . SPEP with reflex to IFE    Standing Status:   Future    Number of Occurrences:   1    Standing Expiration Date:   07/06/2019  . Folate, Serum    Standing Status:   Future  Number of Occurrences:   1    Standing Expiration Date:   07/06/2019  . Vitamin B12    Standing Status:   Future    Number of Occurrences:   1    Standing Expiration Date:   07/06/2019  . Methylmalonic acid, serum    Standing Status:   Future    Number of Occurrences:   1    Standing Expiration Date:   07/06/2019  . Rheumatoid factor    Standing Status:   Future    Number of Occurrences:   1    Standing Expiration Date:   07/06/2019  . ANA, IFA (with reflex)    Standing Status:   Future    Number of Occurrences:   1    Standing Expiration Date:   07/06/2019       Zoila Shutter MD

## 2018-07-07 LAB — RHEUMATOID FACTOR: Rheumatoid fact SerPl-aCnc: <10 IU/mL (ref 0.0–13.9)

## 2018-07-08 LAB — METHYLMALONIC ACID, SERUM: Methylmalonic Acid, Quantitative: 97 nmol/L (ref 0–378)

## 2018-07-09 LAB — ANTINUCLEAR ANTIBODIES, IFA: ANA Ab, IFA: NEGATIVE

## 2018-07-10 ENCOUNTER — Telehealth: Payer: Self-pay | Admitting: Internal Medicine

## 2018-07-10 LAB — PROTEIN ELECTROPHORESIS, SERUM, WITH REFLEX
A/G Ratio: 1.4 (ref 0.7–1.7)
Albumin ELP: 3.7 g/dL (ref 2.9–4.4)
Alpha-1-Globulin: 0.2 g/dL (ref 0.0–0.4)
Alpha-2-Globulin: 0.6 g/dL (ref 0.4–1.0)
Beta Globulin: 1 g/dL (ref 0.7–1.3)
Gamma Globulin: 0.9 g/dL (ref 0.4–1.8)
Globulin, Total: 2.7 g/dL (ref 2.2–3.9)
Total Protein ELP: 6.4 g/dL (ref 6.0–8.5)

## 2018-07-10 NOTE — Telephone Encounter (Signed)
Tried to reach regarding 6/5

## 2018-07-19 ENCOUNTER — Telehealth: Payer: Self-pay | Admitting: Internal Medicine

## 2018-07-19 NOTE — Telephone Encounter (Signed)
Contacted pt to verify telephone visit for pre reg °

## 2018-07-20 ENCOUNTER — Inpatient Hospital Stay: Payer: 59 | Attending: Internal Medicine | Admitting: Internal Medicine

## 2018-07-20 DIAGNOSIS — D72818 Other decreased white blood cell count: Secondary | ICD-10-CM | POA: Diagnosis not present

## 2018-07-20 NOTE — Progress Notes (Signed)
Virtual Visit via Telephone Note  I connected with Megan Petty on 07/20/18 at  8:50 AM EDT by telephone and verified that I am speaking with the correct person using two identifiers.   I discussed the limitations, risks, security and privacy concerns of performing an evaluation and management service by telephone and the availability of in person appointments. I also discussed with the patient that there may be a patient responsible charge related to this service. The patient expressed understanding and agreed to proceed.   Interval History:  Historical data obtained from note dated 07/06/2018.  58 year old female referred for evaluation due to leukopenia.  Pt had labs done 06/12/2018 that showed WBC 3.8 HB 13.6 plts 178,000.  Normal differential.  Chemistries showed K+ 3.8 Cr 0.85 normal LFTs.  She denies any fevers, chills, night sweats and has noted no adenopathy.  Pt has occasional joint pain.  She had gastric bypass in 2012.  She is not currently on bariatric vitamins.  She had colonoscopy 02/01/2018 that showed:  - Perianal skin tags found on perianal exam. - One 3 mm polyp in the ascending colon, removed with a cold snare. Resected and retrieved. - One 4 mm polyp in the transverse colon, removed with a cold snare. Resected and retrieved. - Two 4 to 5 mm polyps in the descending colon, removed with a cold snare. Resected and retrieved. - Diverticulosis in the sigmoid colon. - Internal hemorrhoids  Pathology returned as tubular adenomas with no evidence of malignancy or dysplasia.    She is doing Keto diet and reports she has lost weight and has improved energy.    Observations/Objective: Review of labs dated 07/06/2018.    Assessment and Plan: 1.  Leukopenia.  58 year old female referred for evaluation due to leukopenia.  Pt had labs done 06/12/2018 that showed WBC 3.8 HB 13.6 plts 178,000.  Normal differential.  Chemistries showed K+ 3.8 Cr 0.85 normal LFTs.  She denies any fevers,  chills, night sweats and has noted no adenopathy.  Pt has occasional joint pain.  She had gastric bypass in 2012.  She is not currently on bariatric vitamins.  She had colonoscopy 02/01/2018 that showed:  - Perianal skin tags found on perianal exam. - One 3 mm polyp in the ascending colon, removed with a cold snare. Resected and retrieved. - One 4 mm polyp in the transverse colon, removed with a cold snare. Resected and retrieved. - Two 4 to 5 mm polyps in the descending colon, removed with a cold snare. Resected and retrieved. - Diverticulosis in the sigmoid colon. - Internal hemorrhoids  Pathology returned as tubular adenomas with no evidence of malignancy or dysplasia.    She is doing Keto diet and reports she has lost weight and has improved energy.    Labs done  07/06/2018 reviewed and showed WBC 4.1 HB 13.8 plts 203,000.  Chemistries showed K+ 3.9 Cr 0.87 and normal LFTs.  Pt reassured that counts WNL.  Due to normal counts, no follow-up recommended but pt can be referred back by PCP if counts change.   2.  Joint pain.  Pt has normal ANA, RF, Sed rate and SPEP.  Follow-up with PCP if ongoing symptoms.    3.  Gastric sleeve surgery.  Pt has normal Ferritin, B12, folate, MMA.  Continue supplements as recommended by Bariatric clinic.    4.  Family history of various cancers.  Pt reports she had genetic evaluation in the past.   5.  Health maintenance.  Pt had colonoscopy done 02/01/2018.  Follow-up with GI as recommended.  Mammogram screenings as recommended by PCP.   Follow Up Instructions: No follow-up.  PCP to refer back if needed.    I discussed the assessment and treatment plan with the patient. The patient was provided an opportunity to ask questions and all were answered. The patient agreed with the plan and demonstrated an understanding of the instructions.   The patient was advised to call back or seek an in-person evaluation if the symptoms worsen or if the condition fails to  improve as anticipated.  I provided 15 minutes of non-face-to-face time during this encounter.   Zoila Shutter, MD

## 2018-09-09 NOTE — Progress Notes (Signed)
Subjective:    Megan Petty is a 58 y.o. female and is here for a comprehensive physical exam.  Health Maintenance Due  Topic Date Due  . OPHTHALMOLOGY EXAM  02/01/1972  . URINE MICROALBUMIN  02/01/1972  . TETANUS/TDAP  01/31/1981  . MAMMOGRAM  02/01/2012  . HEMOGLOBIN A1C  04/17/2018   Current Outpatient Medications:  .  gabapentin (NEURONTIN) 100 MG capsule, Take 1 capsule (100 mg total) by mouth at bedtime., Disp: 90 capsule, Rfl: 2  PMHx, SurgHx, SocialHx, Medications, and Allergies were reviewed in the Visit Navigator and updated as appropriate.   Past Medical History:  Diagnosis Date  . Allergy   . Asthma    seasonal  . Cancer ([place])    melanoma- left leg  . Colon polyps   . Diabetes mellitus without complication (HCC)    hx, not any longer  . Family history of breast cancer   . Family history of colon cancer   . Family history of ovarian cancer   . Family history of pancreatic cancer   . GERD (gastroesophageal reflux disease)   . H/O hiatal hernia    had surgery to fix with last surgery  . Hyperlipidemia   . Hypertension      Past Surgical History:  Procedure Laterality Date  . ABDOMINAL HYSTERECTOMY    . ABDOMINOPLASTY/PANNICULECTOMY WITH LIPOSUCTION N/A 12/31/2012   Procedure: PANNICULECTOMY WITH LIPOSUCTION;  Surgeon: Cristine Polio, MD;  Location: [facility];  Service: Plastics;  Laterality: N/A;  . CESAREAN SECTION    . CHOLECYSTECTOMY N/A 11/03/2017   Procedure: LAPAROSCOPIC CHOLECYSTECTOMY;  Surgeon: Clovis Riley, MD;  Location[facility];  Service: General;  Laterality: N/A;  . COLONOSCOPY    . HERNIA REPAIR    . LAPAROSCOPIC GASTRIC SLEEVE RESECTION    . LITHOTRIPSY    . MELANOMA EXCISION    . TONSILLECTOMY       Family History  Problem Relation Age of Onset  . Colon cancer Mother        gallbladder cancer spread to colon  . Cancer Mother 31       gallbladder  . Lung cancer Father 60  . Pancreatic cancer Brother 15       Maternal 1/2  brother  . Breast cancer Paternal Aunt 28  . Kidney cancer Maternal Grandmother   . Lung cancer Maternal Grandfather   . Breast cancer Paternal Grandmother        dx in her 42s  . Ovarian cancer Paternal Grandmother        dx in her 62s  . Esophageal cancer Neg Hx   . Rectal cancer Neg Hx   . Stomach cancer Neg Hx     Social History   Tobacco Use  . Smoking status: Former Smoker    Quit date: 02/14/2009    Years since quitting: 9.5  . Smokeless tobacco: Never Used  Substance Use Topics  . Alcohol use: No  . Drug use: No    Review of Systems:   Pertinent items are noted in the HPI. Otherwise, ROS is negative.  Objective:   BP 124/62   Pulse 76   Temp 98.7 F (37.1 C) (Oral)   Ht 5' 7.5" (1.715 m)   Wt 201 lb 9.6 oz (91.4 kg)   SpO2 98%   BMI 31.11 kg/m   General appearance: alert, cooperative and appears stated age. Head: normocephalic, without obvious abnormality, atraumatic. Neck: no adenopathy, supple, symmetrical, trachea midline; thyroid not enlarged, symmetric,  no tenderness/mass/nodules. Lungs: clear to auscultation bilaterally. Heart: regular rate and rhythm Abdomen: soft, non-tender; no masses,  no organomegaly. Extremities: extremities normal, atraumatic, no cyanosis or edema. Skin: skin color, texture, turgor normal; healing infected cyst left arm. Lymph: cervical, supraclavicular, and axillary nodes normal; no abnormal inguinal nodes palpated. Neurologic: grossly normal.                                      Assessment/Plan:   [name] was seen today for annual exam.  Diagnoses and all orders for this visit:  Routine physical examination  Leg cramps -     gabapentin (NEURONTIN) 100 MG capsule; Take 1 capsule (100 mg total) by mouth at bedtime.  Need for Tdap vaccination -     Tdap (BOOSTRIX) injection 0.5 mL  History of bariatric surgery, gastric sleeve  Type 2 diabetes mellitus without complication, without long-term current use of insulin  (HCC) -     Microalbumin / creatinine urine ratio -     Hemoglobin A1c  Screening for lipid disorders -     Lipid panel  Abscess of arm, left -     sulfamethoxazole-trimethoprim (BACTRIM) 400-80 MG tablet; Take 1 tablet by mouth 2 (two) times daily.  Obesity (BMI 30.0-34.9)    Patient Counseling: [x]    Nutrition: Stressed importance of moderation in sodium/caffeine intake, saturated fat and cholesterol, caloric balance, sufficient intake of fresh fruits, vegetables, fiber, calcium, iron, and 1 mg of folate supplement per day (for females capable of pregnancy).  [x]    Stressed the importance of regular exercise.   [x]    Substance Abuse: Discussed cessation/primary prevention of tobacco, alcohol, or other drug use; driving or other dangerous activities under the influence; availability of treatment for abuse.   [x]    Injury prevention: Discussed safety belts, safety helmets, smoke detector, smoking near bedding or upholstery.   [x]    Sexuality: Discussed sexually transmitted diseases, partner selection, use of condoms, avoidance of unintended pregnancy  and contraceptive alternatives.  [x]    Dental health: Discussed importance of regular tooth brushing, flossing, and dental visits.  [x]    Health maintenance and immunizations reviewed. Please refer to Health maintenance section.   Briscoe Deutscher, DO [place]

## 2018-09-10 ENCOUNTER — Encounter: Payer: Self-pay | Admitting: Family Medicine

## 2018-09-10 ENCOUNTER — Other Ambulatory Visit: Payer: Self-pay

## 2018-09-10 ENCOUNTER — Ambulatory Visit (INDEPENDENT_AMBULATORY_CARE_PROVIDER_SITE_OTHER): Payer: 59 | Admitting: Family Medicine

## 2018-09-10 VITALS — BP 124/62 | HR 76 | Temp 98.7°F | Ht 67.5 in | Wt 201.6 lb

## 2018-09-10 DIAGNOSIS — Z13220 Encounter for screening for lipoid disorders: Secondary | ICD-10-CM

## 2018-09-10 DIAGNOSIS — Z0000 Encounter for general adult medical examination without abnormal findings: Secondary | ICD-10-CM | POA: Diagnosis not present

## 2018-09-10 DIAGNOSIS — E669 Obesity, unspecified: Secondary | ICD-10-CM

## 2018-09-10 DIAGNOSIS — R252 Cramp and spasm: Secondary | ICD-10-CM

## 2018-09-10 DIAGNOSIS — E119 Type 2 diabetes mellitus without complications: Secondary | ICD-10-CM | POA: Diagnosis not present

## 2018-09-10 DIAGNOSIS — Z9884 Bariatric surgery status: Secondary | ICD-10-CM | POA: Diagnosis not present

## 2018-09-10 DIAGNOSIS — Z23 Encounter for immunization: Secondary | ICD-10-CM | POA: Diagnosis not present

## 2018-09-10 DIAGNOSIS — E66811 Obesity, class 1: Secondary | ICD-10-CM | POA: Insufficient documentation

## 2018-09-10 DIAGNOSIS — L02414 Cutaneous abscess of left upper limb: Secondary | ICD-10-CM

## 2018-09-10 LAB — LIPID PANEL
Cholesterol: 177 mg/dL (ref 0–200)
HDL: 91.7 mg/dL (ref >39.00)
LDL Cholesterol: 77 mg/dL (ref 0–99)
NonHDL: 84.94
Total CHOL/HDL Ratio: 2
Triglycerides: 41 mg/dL (ref 0.0–149.0)
VLDL: 8.2 mg/dL (ref 0.0–40.0)

## 2018-09-10 LAB — MICROALBUMIN / CREATININE URINE RATIO
Creatinine,U: 151.5 mg/dL
Microalb Creat Ratio: 0.5 mg/g (ref 0.0–30.0)
Microalb, Ur: <0.7 mg/dL (ref 0.0–1.9)

## 2018-09-10 LAB — HEMOGLOBIN A1C: Hgb A1c MFr Bld: 5.2 % (ref 4.6–6.5)

## 2018-09-10 MED ORDER — TETANUS-DIPHTH-ACELL PERTUSSIS 5-2.5-18.5 LF-MCG/0.5 IM SUSP
0.5000 mL | Freq: Once | INTRAMUSCULAR | Status: AC
  Administered 2018-09-10: 0.5 mL via INTRAMUSCULAR

## 2018-09-10 MED ORDER — SULFAMETHOXAZOLE-TRIMETHOPRIM 400-80 MG PO TABS: 1.0000 | ORAL_TABLET | Freq: Two times a day (BID) | ORAL | 0 refills | Status: DC

## 2018-09-10 MED ORDER — GABAPENTIN 100 MG PO CAPS: 100.0000 mg | ORAL_CAPSULE | Freq: Every day | ORAL | 2 refills | Status: DC

## 2018-12-12 ENCOUNTER — Ambulatory Visit: Payer: 59 | Admitting: Family Medicine

## 2018-12-21 ENCOUNTER — Ambulatory Visit: Payer: 59 | Admitting: Physician Assistant

## 2018-12-25 DIAGNOSIS — N809 Endometriosis, unspecified: Secondary | ICD-10-CM | POA: Insufficient documentation

## 2019-03-26 ENCOUNTER — Ambulatory Visit (INDEPENDENT_AMBULATORY_CARE_PROVIDER_SITE_OTHER): Payer: No Typology Code available for payment source | Admitting: Physician Assistant

## 2019-03-26 ENCOUNTER — Other Ambulatory Visit: Payer: Self-pay

## 2019-03-26 ENCOUNTER — Encounter: Payer: Self-pay | Admitting: Physician Assistant

## 2019-03-26 VITALS — BP 110/70 | HR 77 | Temp 98.2°F | Ht 67.5 in | Wt 223.2 lb

## 2019-03-26 DIAGNOSIS — H6122 Impacted cerumen, left ear: Secondary | ICD-10-CM | POA: Diagnosis not present

## 2019-03-26 MED ORDER — AMOXICILLIN 875 MG PO TABS: 875.0000 mg | ORAL_TABLET | Freq: Two times a day (BID) | ORAL | 0 refills | Status: DC

## 2019-03-26 NOTE — Patient Instructions (Signed)
It was great to see you!  Start oral antibiotic for ear infection.  May use cotton ball soaked in mineral oil into ear 10-20 min per week if having recurrent ear wax accumulation. May use dilute hydrogen peroxide (equal parts this and water) and place a few drops into ear every 2 weeks to help break down wax buildup.  Push fluids and get plenty of rest. Please return if you are not improving as expected, or if you have high fevers (>101.5) or difficulty swallowing or worsening productive cough.  Call clinic with questions.  I hope you start feeling better soon!

## 2019-03-26 NOTE — Progress Notes (Signed)
Megan Petty is a 59 y.o. female here for a new problem.  I acted as a Education administrator for [employer], PA-C Anselmo Pickler, LPN  History of Present Illness:   Chief Complaint  Patient presents with  . Otalgia    HPI   Otalgia Pt c/o left ear pain and pressure x 3 days. Also woke up this morning with a slight headache. Has not taken any medications.  Has tenderness to area in front of her ear. Hx of requiring ear lavages in the past.  Denies: cough, congestion, sore throat, n/v, fever, chills  Past Medical History:  Diagnosis Date  . Allergy   . Asthma    seasonal  . Cancer ([place])    melanoma- left leg  . Colon polyps   . Diabetes mellitus without complication (HCC)    hx, not any longer  . Family history of breast cancer   . Family history of colon cancer   . Family history of ovarian cancer   . Family history of pancreatic cancer   . GERD (gastroesophageal reflux disease)   . H/O hiatal hernia    had surgery to fix with last surgery  . Hyperlipidemia   . Hypertension      Social History   Socioeconomic History  . Marital status: Married    Spouse name: Not on file  . Number of children: 2  . Years of education: Not on file  . Highest education level: Not on file  Occupational History  . Not on file  Tobacco Use  . Smoking status: Former Smoker    Quit date: 02/14/2009    Years since quitting: 10.1  . Smokeless tobacco: Never Used  Substance and Sexual Activity  . Alcohol use: No  . Drug use: No  . Sexual activity: Not on file  Other Topics Concern  . Not on file  Social History Narrative  . Not on file   Social Determinants of Health   Financial Resource Strain:   . Difficulty of Paying Living Expenses: Not on file  Food Insecurity:   . Worried About Charity fundraiser in the Last Year: Not on file  . Ran Out of Food in the Last Year: Not on file  Transportation Needs:   . Lack of Transportation (Medical): Not on file  . Lack of Transportation  (Non-Medical): Not on file  Physical Activity:   . Days of Exercise per Week: Not on file  . Minutes of Exercise per Session: Not on file  Stress:   . Feeling of Stress : Not on file  Social Connections:   . Frequency of Communication with Friends and Family: Not on file  . Frequency of Social Gatherings with Friends and Family: Not on file  . Attends Religious Services: Not on file  . Active Member of Clubs or Organizations: Not on file  . Attends Archivist Meetings: Not on file  . Marital Status: Not on file  Intimate Partner Violence:   . Fear of Current or Ex-Partner: Not on file  . Emotionally Abused: Not on file  . Physically Abused: Not on file  . Sexually Abused: Not on file    Past Surgical History:  Procedure Laterality Date  . ABDOMINAL HYSTERECTOMY    . ABDOMINOPLASTY/PANNICULECTOMY WITH LIPOSUCTION N/A 12/31/2012   Procedure: PANNICULECTOMY WITH LIPOSUCTION;  Surgeon: Cristine Polio, MD;  Location: [facility];  Service: Plastics;  Laterality: N/A;  . CESAREAN SECTION    . CHOLECYSTECTOMY N/A 11/03/2017  Procedure: LAPAROSCOPIC CHOLECYSTECTOMY;  Surgeon: Clovis Riley, MD;  Location[facility];  Service: General;  Laterality: N/A;  . COLONOSCOPY    . HERNIA REPAIR    . LAPAROSCOPIC GASTRIC SLEEVE RESECTION    . LITHOTRIPSY    . MELANOMA EXCISION    . TONSILLECTOMY      Family History  Problem Relation Age of Onset  . Colon cancer Mother        gallbladder cancer spread to colon  . Cancer Mother 41       gallbladder  . Lung cancer Father 77  . Pancreatic cancer Brother 59       Maternal 1/2 brother  . Breast cancer Paternal Aunt 91  . Kidney cancer Maternal Grandmother   . Lung cancer Maternal Grandfather   . Breast cancer Paternal Grandmother        dx in her 37s  . Ovarian cancer Paternal Grandmother        dx in her 24s  . Esophageal cancer Neg Hx   . Rectal cancer Neg Hx   . Stomach cancer Neg Hx     Allergies  Allergen Reactions  .  Vicodin [Hydrocodone-Acetaminophen] Itching    Current Medications:   Current Outpatient Medications:  .  amoxicillin (AMOXIL) 875 MG tablet, Take 1 tablet (875 mg total) by mouth 2 (two) times daily., Disp: 20 tablet, Rfl: 0 .  gabapentin (NEURONTIN) 100 MG capsule, Take 1 capsule (100 mg total) by mouth at bedtime. (Patient not taking: Reported on 03/26/2019), Disp: 90 capsule, Rfl: 2   Review of Systems:   ROS Negative unless otherwise specified per HPI.  Vitals:   Vitals:   03/26/19 1434  BP: 110/70  Pulse: 77  Temp: 98.2 F (36.8 C)  TempSrc: Temporal  SpO2: 98%  Weight: 223 lb 4 oz (101.3 kg)  Height: 5' 7.5" (1.715 m)     Body mass index is 34.45 kg/m.  Physical Exam:   Physical Exam Vitals and nursing note reviewed.  Constitutional:      General: She is not in acute distress.    Appearance: She is well-developed. She is not ill-appearing or toxic-appearing.  HENT:     Head: Normocephalic and atraumatic.     Right Ear: Tympanic membrane, ear canal and external ear normal. Tympanic membrane is not erythematous, retracted or bulging.     Left Ear: Ear canal and external ear normal.     Nose: Nose normal.     Right Sinus: No maxillary sinus tenderness or frontal sinus tenderness.     Left Sinus: No maxillary sinus tenderness or frontal sinus tenderness.     Mouth/Throat:     Pharynx: Uvula midline. No posterior oropharyngeal erythema.  Eyes:     General: Lids are normal.     Conjunctiva/sclera: Conjunctivae normal.  Neck:     Trachea: Trachea normal.  Musculoskeletal:        General: Normal range of motion.     Cervical back: Normal range of motion and neck supple.  Lymphadenopathy:     Head:     Left side of head: Preauricular adenopathy present.     Cervical: Cervical adenopathy present.     Left cervical: Superficial cervical adenopathy present.  Skin:    General: Skin is warm and dry.  Neurological:     Mental Status: She is alert and oriented to  person, place, and time.  Psychiatric:        Speech: Speech normal.  Behavior: Behavior normal. Behavior is cooperative.        Thought Content: Thought content normal.        Judgment: Judgment normal.    Procedure: Cerumen Disimpaction Warm water was applied and gentle ear lavage performed on the left side. There were no complications and following the disimpaction the tympanic membrane was visible on the left. Tympanic membrane is erythematous following the procedure.  Auditory canal is slightly inflamed.  The patient reported mild relief of symptoms after removal of cerumen.    Assessment and Plan:   Merial was seen today for otalgia.  Diagnoses and all orders for this visit:  Impacted cerumen of left ear  Other orders -     amoxicillin (AMOXIL) 875 MG tablet; Take 1 tablet (875 mg total) by mouth 2 (two) times daily.   After irrigation, TM was visible and appeared erythematous. Will trial oral amoxicillin for possible ear infection and close follow-up if no improvement of symptoms.  . Reviewed expectations re: course of current medical issues. . Discussed self-management of symptoms. . Outlined signs and symptoms indicating need for more acute intervention. . Patient verbalized understanding and all questions were answered. . See orders for this visit as documented in the electronic medical record. . Patient received an After-Visit Summary.  CMA or LPN served as scribe during this visit. History, Physical, and Plan performed by medical provider. The above documentation has been reviewed and is accurate and complete.   Inda Coke, PA-C

## 2019-04-19 ENCOUNTER — Ambulatory Visit (INDEPENDENT_AMBULATORY_CARE_PROVIDER_SITE_OTHER): Payer: 59 | Admitting: Physician Assistant

## 2019-04-19 ENCOUNTER — Encounter: Payer: Self-pay | Admitting: Physician Assistant

## 2019-04-19 ENCOUNTER — Other Ambulatory Visit: Payer: Self-pay

## 2019-04-19 VITALS — BP 104/72 | HR 73 | Temp 98.1°F | Ht 67.5 in | Wt 225.5 lb

## 2019-04-19 DIAGNOSIS — Z91038 Other insect allergy status: Secondary | ICD-10-CM | POA: Insufficient documentation

## 2019-04-19 MED ORDER — EPINEPHRINE 0.3 MG/0.3ML IJ SOAJ: 0.3000 mg | INTRAMUSCULAR | 1 refills | Status: DC | PRN

## 2019-04-19 NOTE — Progress Notes (Signed)
Megan Petty is a 59 y.o. female is here to discuss: Transfer of care.  I acted as a Education administrator for [employer], PA-C Anselmo Pickler, LPN  History of Present Illness:   Chief Complaint  Patient presents with  . Transfer of care    HPI   Pt is here for transfer of care from Dr. Juleen Petty.  Allergic reaction Allergic reaction to ant bites in the past -- had tongue swelling, hives, itching. She has an epi-pen and benadryl on hand however her epi-pen is not UTD. Needs new epi-pen.      Health Maintenance Due  Topic Date Due  . OPHTHALMOLOGY EXAM  02/01/1972  . MAMMOGRAM  02/01/2012  . FOOT EXAM  12/12/2018  . HEMOGLOBIN A1C  03/13/2019    Past Medical History:  Diagnosis Date  . Allergy   . Asthma    seasonal  . Cancer ([place])    melanoma- left leg  . Colon polyps   . Diabetes mellitus without complication (HCC)    hx, not any longer  . Family history of breast cancer   . Family history of colon cancer   . Family history of ovarian cancer   . Family history of pancreatic cancer   . GERD (gastroesophageal reflux disease)   . H/O hiatal hernia    had surgery to fix with last surgery  . Hyperlipidemia   . Hypertension      Social History   Socioeconomic History  . Marital status: Married    Spouse name: Not on file  . Number of children: 2  . Years of education: Not on file  . Highest education level: Not on file  Occupational History  . Not on file  Tobacco Use  . Smoking status: Former Smoker    Quit date: 02/14/2009    Years since quitting: 10.1  . Smokeless tobacco: Never Used  Substance and Sexual Activity  . Alcohol use: No  . Drug use: No  . Sexual activity: Not on file  Other Topics Concern  . 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 Difficulty of Paying Living Expenses: Not on file  Food Insecurity:     . Worried About Charity fundraiser in the Last Year: Not on file  . Ran Out of Food in the Last Year: Not on file  Transportation Needs:   . Lack of Transportation (Medical): Not on file  . Lack of Transportation (Non-Medical): Not on file  Physical Activity:   . Days of Exercise per Week: Not on file  . Minutes of Exercise per Session: Not on file  Stress:   . Feeling of Stress : Not on file  Social Connections:   . Frequency of Communication with Friends and Family: Not on file  . Frequency of Social Gatherings with Friends and Family: Not on file  . Attends Religious Services: Not on file  . Active Member of Clubs or Organizations: Not on file  . Attends Archivist Meetings: Not on file  . Marital Status: Not on file  Intimate Partner Violence:   . Fear of Current or Ex-Partner: Not on file  . Emotionally Abused: Not on file  . Physically Abused: Not on file  . Sexually Abused: Not on file    Past Surgical History:  Procedure Laterality Date  .  ABDOMINAL HYSTERECTOMY    . ABDOMINOPLASTY/PANNICULECTOMY WITH LIPOSUCTION N/A 12/31/2012   Procedure: PANNICULECTOMY WITH LIPOSUCTION;  Surgeon: Cristine Polio, MD;  Location: [facility];  Service: Plastics;  Laterality: N/A;  . CESAREAN SECTION    . CHOLECYSTECTOMY N/A 11/03/2017   Procedure: LAPAROSCOPIC CHOLECYSTECTOMY;  Surgeon: Clovis Riley, MD;  [facility];  Service: General;  Laterality: N/A;  . COLONOSCOPY    . HERNIA REPAIR    . LAPAROSCOPIC GASTRIC SLEEVE RESECTION    . LITHOTRIPSY    . MELANOMA EXCISION    . TONSILLECTOMY      Family History  Problem Relation Age of Onset  . Colon cancer Mother        gallbladder cancer spread to colon  . Cancer Mother 58       gallbladder  . Lung cancer Father 67  . Pancreatic cancer Brother 88       Maternal 1/2 brother  . Breast cancer Paternal Aunt 23  . Kidney cancer Maternal Grandmother   . Lung cancer Maternal Grandfather   . Breast cancer Paternal  Grandmother        dx in her 55s  . Ovarian cancer Paternal Grandmother        dx in her 60s  . Esophageal cancer Neg Hx   . Rectal cancer Neg Hx   . Stomach cancer Neg Hx     PMHx, SurgHx, SocialHx, FamHx, Medications, and Allergies were reviewed in the Visit Navigator and updated as appropriate.   Patient Active Problem List   Diagnosis Date Noted  . Endometriosis 12/25/2018  . Obesity (BMI 30.0-34.9) 09/10/2018  . Mild intermittent asthma without complication [id number]  . History of cholecystectomy 12/10/2017  . Kidney stone 09/08/2014  . Colon polyps   . History of laparoscopic partial gastrectomy 02/17/2011    Social History   Tobacco Use  . Smoking status: Former Smoker    Quit date: 02/14/2009    Years since quitting: 10.1  . Smokeless tobacco: Never Used  Substance Use Topics  . Alcohol use: No  . Drug use: No    Current Medications and Allergies:    Current Outpatient Medications:  .  betamethasone valerate (VALISONE) 0.1 % cream, as needed. , Disp: , Rfl:  .  EPINEPHrine 0.3 mg/0.3 mL IJ SOAJ injection, Inject 0.3 mLs (0.3 mg total) into the muscle as needed for anaphylaxis., Disp: 2 each, Rfl: 1   Allergies  Allergen Reactions  . Vicodin [Hydrocodone-Acetaminophen] Itching    Review of Systems   ROS Negative unless otherwise specified per HPI.  Vitals:   Vitals:   04/19/19 1329  BP: 104/72  Pulse: 73  Temp: 98.1 F (36.7 C)  TempSrc: Temporal  SpO2: 98%  Weight: 225 lb 8 oz (102.3 kg)  Height: 5' 7.5" (1.715 m)     Body mass index is 34.8 kg/m.   Physical Exam:    Physical Exam Constitutional:      Appearance: She is well-developed.  HENT:     Head: Normocephalic and atraumatic.  Eyes:     Conjunctiva/sclera: Conjunctivae normal.  Pulmonary:     Effort: Pulmonary effort is normal.  Musculoskeletal:        General: Normal range of motion.     Cervical back: Normal range of motion and neck supple.  Skin:    General: Skin is  warm and dry.  Neurological:     Mental Status: She is alert and oriented to person, place, and time.  Psychiatric:  Behavior: Behavior normal.        Thought Content: Thought content normal.        Judgment: Judgment normal.      Assessment and Plan:    Megan Petty was seen today for transfer of care.  Diagnoses and all orders for this visit:  Allergy to ant bite  Other orders -     EPINEPHrine 0.3 mg/0.3 mL IJ SOAJ injection; Inject 0.3 mLs (0.3 mg total) into the muscle as needed for anaphylaxis.   No red flags on discussion. Refill epi-pen today. Follow-up in the fall for CPE.  Marland Kitchen Reviewed expectations re: course of current medical issues. . Discussed self-management of symptoms. . Outlined signs and symptoms indicating need for more acute intervention. . Patient verbalized understanding and all questions were answered. . See orders for this visit as documented in the electronic medical record. . Patient received an After Visit Summary.  CMA or LPN served as scribe during this visit. History, Physical, and Plan performed by medical provider. The above documentation has been reviewed and is accurate and complete.   Inda Coke, PA-C [place] [place] 04/19/2019  Follow-up: No follow-ups on file.

## 2019-05-02 ENCOUNTER — Ambulatory Visit: Payer: 59

## 2019-06-25 ENCOUNTER — Telehealth: Payer: Self-pay | Admitting: Physician Assistant

## 2019-06-25 ENCOUNTER — Encounter (HOSPITAL_BASED_OUTPATIENT_CLINIC_OR_DEPARTMENT_OTHER): Payer: Self-pay | Admitting: Emergency Medicine

## 2019-06-25 ENCOUNTER — Emergency Department (HOSPITAL_BASED_OUTPATIENT_CLINIC_OR_DEPARTMENT_OTHER)
Admission: EM | Admit: 2019-06-25 | Discharge: 2019-06-25 | Disposition: A | Discharge status: Home / Self Care | Payer: 59 | Attending: Emergency Medicine | Admitting: Emergency Medicine

## 2019-06-25 ENCOUNTER — Other Ambulatory Visit: Payer: Self-pay

## 2019-06-25 ENCOUNTER — Emergency Department (HOSPITAL_BASED_OUTPATIENT_CLINIC_OR_DEPARTMENT_OTHER): Payer: 59

## 2019-06-25 DIAGNOSIS — Z87891 Personal history of nicotine dependence: Secondary | ICD-10-CM | POA: Diagnosis not present

## 2019-06-25 DIAGNOSIS — R0789 Other chest pain: Secondary | ICD-10-CM | POA: Diagnosis not present

## 2019-06-25 DIAGNOSIS — Z885 Allergy status to narcotic agent status: Secondary | ICD-10-CM | POA: Insufficient documentation

## 2019-06-25 DIAGNOSIS — E119 Type 2 diabetes mellitus without complications: Secondary | ICD-10-CM | POA: Diagnosis not present

## 2019-06-25 LAB — URINALYSIS, ROUTINE W REFLEX MICROSCOPIC
Bilirubin Urine: NEGATIVE
Glucose, UA: NEGATIVE mg/dL
Hgb urine dipstick: NEGATIVE
Ketones, ur: NEGATIVE mg/dL
Leukocytes,Ua: NEGATIVE
Nitrite: NEGATIVE
Protein, ur: NEGATIVE mg/dL
Specific Gravity, Urine: >1.03 — ABNORMAL HIGH (ref 1.005–1.030)
pH: 5.5 (ref 5.0–8.0)

## 2019-06-25 NOTE — ED Notes (Signed)
Pt discharged to home. Discharge instructions have been discussed with patient and/or family members. Pt verbally acknowledges understanding d/c instructions, and endorses comprehension to checkout at registration before leaving.  °

## 2019-06-25 NOTE — Telephone Encounter (Signed)
Chief Complaint BREATHING - shortness of breath or sounds breathless Reason for Call Symptomatic / Request for [place] states is w/[facility] - Pt is not sure if she cracked her rib but it hurts when she takes a deep breath. Translation No Nurse Assessment Nurse: Raphael Gibney, RN, Vera Date/Time (Eastern Time): 06/25/2019 2:33:09 PM Confirm and document reason for call. If symptomatic, describe symptoms. ---Caller states she was in car on Sunday on the front seat and she turned around trying to get something from the back seat. heard a pop. She has pain on her left rib area. She is unable to take a deep breath. has been taking aleve. no pain unless she moves. Has the patient had close contact with a person known or suspected to have the novel coronavirus illness OR traveled / lives in area with major community spread (including international travel) in the last 14 days from the onset of symptoms? * If Asymptomatic, screen for exposure and travel within the last 14 days. ---No Does the patient have any new or worsening symptoms? ---Yes Will a triage be completed? ---Yes Related visit to physician within the last 2 weeks? ---No Does the PT have any chronic conditions? (i.e. diabetes, asthma, this includes High risk factors for pregnancy, etc.) ---No Is this a behavioral health or substance abuse call? ---No Guidelines Guideline Title Affirmed Question Affirmed Notes Nurse Date/Time (Eastern Time) Chest Pain Long-distance travel in past month (e.g., car, bus, train, plane; with trip lasting 6 or more hours) Raphael Gibney, Therapist, sports, Vanita Ingles 06/25/2019 2:36:16 PMPLEASE NOTE: All timestamps contained within this report are represented as [place] Standard Time. CONFIDENTIALTY NOTICE: This fax transmission is intended only for the addressee. It contains information that is legally privileged, confidential or otherwise protected from use or disclosure. If you are  not the intended recipient, you are strictly prohibited from reviewing, disclosing, copying using or disseminating any of this information or taking any action in reliance on or regarding this information. If you have received this fax in error, please notify us immediately by telephone so that we can arrange for its return to [place]. Phone: [phone], Toll-Free: [phone], Fax: [phone] Page: 2 of 2 Call Id: [id number] [place]. Time Eilene Ghazi Time) Disposition Final User 06/25/2019 2:30:49 PM Send to Urgent Tawanna Cooler 06/25/2019 2:40:17 PM Go to ED Now Yes Raphael Gibney, RN, Doreatha Lew Disagree/Comply Comply Caller Understands Yes PreDisposition Call Doctor Care Advice Given Per Guideline GO TO ED NOW: * You need to be seen in the Emergency Department. ANOTHER ADULT SHOULD DRIVE: * It is better and safer if another adult drives instead of you. Referrals GO TO FACILITY UNDECIDE

## 2019-06-25 NOTE — Discharge Instructions (Addendum)
Continue aleve.  Return if any problems.

## 2019-06-25 NOTE — ED Provider Notes (Signed)
[facility] EMERGENCY DEPARTMENT Provider Note   CSN: [id number] Arrival date & time: 06/25/19  1506     History Chief Complaint  Patient presents with  . Abdominal Pain    Megan Petty is a 59 y.o. female.  The history is provided by the patient. No language interpreter was used.  Chest Pain Pain location:  L chest Pain quality: sharp and stabbing   Pain radiates to:  Does not radiate Pain severity:  Moderate Onset quality:  Gradual Duration:  3 days Timing:  Constant Progression:  Worsening Chronicity:  New Worsened by:  Nothing Ineffective treatments:  None tried Associated symptoms: no shortness of breath   Risk factors: no coronary artery disease   Pt reports pain in her left chest after leaning over a chair.  Pain now with a deep breath.  Pain with movement     Past Medical History:  Diagnosis Date  . Allergy   . Asthma    seasonal  . Cancer ([place])    melanoma- left leg  . Colon polyps   . Diabetes mellitus without complication (HCC)    hx, not any longer  . Family history of breast cancer   . Family history of colon cancer   . Family history of ovarian cancer   . Family history of pancreatic cancer   . GERD (gastroesophageal reflux disease)   . H/O hiatal hernia    had surgery to fix with last surgery  . Hyperlipidemia   . Hypertension     Patient Active Problem List   Diagnosis Date Noted  . 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 Endometriosis 12/25/2018  . Obesity (BMI 30.0-34.9) 09/10/2018  . Mild intermittent asthma without complication [id number]  . History of cholecystectomy 12/10/2017  . Kidney stone 09/08/2014  . Colon polyps   . History of laparoscopic partial gastrectomy 02/17/2011    Past Surgical History:  Procedure Laterality Date  . ABDOMINAL HYSTERECTOMY    . ABDOMINOPLASTY/PANNICULECTOMY WITH LIPOSUCTION N/A 12/31/2012   Procedure: PANNICULECTOMY WITH LIPOSUCTION;  Surgeon: Cristine Polio, MD;  Location: [facility];   Service: Plastics;  Laterality: N/A;  . CESAREAN SECTION    . CHOLECYSTECTOMY N/A 11/03/2017   Procedure: LAPAROSCOPIC CHOLECYSTECTOMY;  Surgeon: Clovis Riley, MD;  [facility];  Service: General;  Laterality: N/A;  . COLONOSCOPY    . HERNIA REPAIR    . LAPAROSCOPIC GASTRIC SLEEVE RESECTION    . LITHOTRIPSY    . MELANOMA EXCISION    . TONSILLECTOMY       OB History   No obstetric history on file.     Family History  Problem Relation Age of Onset  . Colon cancer Mother        gallbladder cancer spread to colon  . Cancer Mother 2       gallbladder  . Lung cancer Father 57  . Pancreatic cancer Brother 1       Maternal 1/2 brother  . Breast cancer Paternal Aunt 40  . Kidney cancer Maternal Grandmother   . Lung cancer Maternal Grandfather   . Breast cancer Paternal Grandmother        dx in her 24s  . Ovarian cancer Paternal Grandmother        dx in her 9s  . Esophageal cancer Neg Hx   . Rectal cancer Neg Hx   . Stomach cancer Neg Hx     Social History   Tobacco Use  . Smoking status: Former Smoker  Quit date: 02/14/2009    Years since quitting: 10.3  . Smokeless tobacco: Never Used  Substance Use Topics  . Alcohol use: No  . Drug use: No    Home Medications Prior to Admission medications   Medication Sig Start Date End Date Taking? Authorizing Provider  betamethasone valerate (VALISONE) 0.1 % cream as needed.     [provider]  EPINEPHrine 0.3 mg/0.3 mL IJ SOAJ injection Inject 0.3 mLs (0.3 mg total) into the muscle as needed for anaphylaxis. 04/19/19   Inda Coke, PA    Allergies    Vicodin [hydrocodone-acetaminophen]  Review of Systems   Review of Systems  Respiratory: Negative for shortness of breath.   Cardiovascular: Positive for chest pain.  All other systems reviewed and are negative.   Physical Exam Updated Vital Signs BP (!) 142/88 (BP Location: Right Arm)   Pulse 86   Temp 98.3 F (36.8 C) (Oral)   Resp 20   Ht  5\' 8"  (1.727 m)   Wt 104.5 kg   SpO2 100%   BMI 35.03 kg/m   Physical Exam Vitals and nursing note reviewed.  Constitutional:      Appearance: She is well-developed.  HENT:     Head: Normocephalic.  Eyes:     Extraocular Movements: Extraocular movements intact.  Cardiovascular:     Rate and Rhythm: Normal rate.  Pulmonary:     Effort: Pulmonary effort is normal.  Abdominal:     General: Abdomen is flat. Bowel sounds are normal. There is no distension.     Palpations: Abdomen is soft.     Tenderness: There is no abdominal tenderness.  Musculoskeletal:        General: Normal range of motion.     Cervical back: Normal range of motion.  Skin:    General: Skin is warm.  Neurological:     Mental Status: She is alert and oriented to person, place, and time.  Psychiatric:        Mood and Affect: Mood normal.     ED Results / Procedures / Treatments   Labs (all labs ordered are listed, but only abnormal results are displayed) Labs Reviewed  URINALYSIS, ROUTINE W REFLEX MICROSCOPIC - Abnormal; Notable for the following components:      Result Value   Specific Gravity, Urine >1.030 (*)    All other components within normal limits    EKG None  Radiology DG Chest 2 View  Result Date: 06/25/2019 CLINICAL DATA:  Chest pain EXAM: CHEST - 2 VIEW COMPARISON:  12/21/2012 FINDINGS: Heart and mediastinal contours are within normal limits. No focal opacities or effusions. No acute bony abnormality. IMPRESSION: No active cardiopulmonary disease. Electronically Signed   By: Rolm Baptise M.D.   On: 06/25/2019 17:17    Procedures Procedures (including critical care time)  Medications Ordered in ED Medications - No data to display  ED Course  I have reviewed the triage vital signs and the nursing notes.  Pertinent labs & imaging results that were available during my care of the patient were reviewed by me and considered in my medical decision making (see chart for details).      MDM Rules/Calculators/A&P                      MDM:  Chest xray is normal.  Pt took aleve with relief of pain.  Chest wall is tender,  Abdomen is nontender.  I think pain is muscular.  I advised pt  to continue aleve.  Return if symptoms worsen or change Final Clinical Impression(s) / ED Diagnoses Final diagnoses:  Chest wall pain    Rx / DC Orders ED Discharge Orders    None    An After Visit Summary was printed and given to the patient.    Fransico Meadow, PA-C 06/25/19 1735    Drenda Freeze, MD 06/28/19 1537

## 2019-06-25 NOTE — Telephone Encounter (Signed)
Pt went to ER

## 2019-06-25 NOTE — ED Triage Notes (Signed)
Left side abd/rib pain for 3 days, states may have lifted or moved wrong. VSS. Alert, amb.

## 2019-08-12 ENCOUNTER — Telehealth (INDEPENDENT_AMBULATORY_CARE_PROVIDER_SITE_OTHER): Payer: No Typology Code available for payment source | Admitting: Physician Assistant

## 2019-08-12 ENCOUNTER — Encounter: Payer: Self-pay | Admitting: Physician Assistant

## 2019-08-12 ENCOUNTER — Other Ambulatory Visit: Payer: Self-pay

## 2019-08-12 VITALS — Ht 68.0 in | Wt 227.0 lb

## 2019-08-12 DIAGNOSIS — J4530 Mild persistent asthma, uncomplicated: Secondary | ICD-10-CM

## 2019-08-12 MED ORDER — ALBUTEROL SULFATE HFA 108 (90 BASE) MCG/ACT IN AERS
2.0000 | INHALATION_SPRAY | Freq: Four times a day (QID) | RESPIRATORY_TRACT | 3 refills | Status: DC | PRN
Start: 2019-08-12 — End: 2019-12-04

## 2019-08-12 MED ORDER — PREDNISONE 20 MG PO TABS: 40.0000 mg | ORAL_TABLET | Freq: Every day | ORAL | 0 refills | Status: DC

## 2019-08-12 NOTE — Progress Notes (Signed)
Virtual Visit via Video   I connected with Megan Petty on 08/12/19 at  3:30 PM EDT by a video enabled telemedicine application and verified that I am speaking with the correct person using two identifiers. Location patient: Home Location provider: [facility], Office Persons participating in the virtual visit: Kimberley, Speece PA-C, Anselmo Pickler, LPN   I discussed the limitations of evaluation and management by telemedicine and the availability of in person appointments. The patient expressed understanding and agreed to proceed.  I acted as a Education administrator for [employer], PA-C [employer], LPN   Subjective:   HPI:   Cough  Pt c/o cough and chest congestion since Saturday felt like she was having an asthma attack on Saturday from the dog she was watching, Sunday got worse. Took Benadryl on Saturday and Allegra on Sunday. [facility] came back Negative. Pt ran out of her inhaler while ago has not needed on in about a year, used ProAir.  Denies: chest pain, wheezing, n/v, malaise  ROS: See pertinent positives and negatives per HPI.  Patient Active Problem List   Diagnosis Date Noted  . 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 Endometriosis 12/25/2018  . Obesity (BMI 30.0-34.9) 09/10/2018  . Mild intermittent asthma without complication 02/77/4128  . History of cholecystectomy 12/10/2017  . Kidney stone 09/08/2014  . Colon polyps   . History of laparoscopic partial gastrectomy 02/17/2011    Social History   Tobacco Use  . Smoking status: Former Smoker    Quit date: 02/14/2009    Years since quitting: 10.4  . Smokeless tobacco: Never Used  Substance Use Topics  . Alcohol use: No    Current Outpatient Medications:  .  betamethasone valerate (VALISONE) 0.1 % cream, as needed. , Disp: , Rfl:  .  EPINEPHrine 0.3 mg/0.3 mL IJ SOAJ injection, Inject 0.3 mLs (0.3 mg total) into the muscle as needed for anaphylaxis.,  Disp: 2 each, Rfl: 1 .  albuterol (VENTOLIN HFA) 108 (90 Base) MCG/ACT inhaler, Inhale 2 puffs into the lungs every 6 (six) hours as needed for wheezing or shortness of breath., Disp: 18 g, Rfl: 3 .  predniSONE (DELTASONE) 20 MG tablet, Take 2 tablets (40 mg total) by mouth daily., Disp: 10 tablet, Rfl: 0  Allergies  Allergen Reactions  . Vicodin [Hydrocodone-Acetaminophen] Itching    Objective:   VITALS: Per patient if applicable, see vitals. GENERAL: Alert, appears well and in no acute distress. HEENT: Atraumatic, conjunctiva clear, no obvious abnormalities on inspection of external nose and ears. NECK: Normal movements of the head and neck. CARDIOPULMONARY: No increased WOB. Speaking in clear sentences. I:E ratio WNL.  MS: Moves all visible extremities without noticeable abnormality. PSYCH: Pleasant and cooperative, well-groomed. Speech normal rate and rhythm. Affect is appropriate. Insight and judgement are appropriate. Attention is focused, linear, and appropriate.  NEURO: CN grossly intact. Oriented as arrived to appointment on time with no prompting. Moves both UE equally.  SKIN: No obvious lesions, wounds, erythema, or cyanosis noted on face or hands.  Assessment and Plan:   Cordell was seen today for cough.  Diagnoses and all orders for this visit:  Mild intermittent asthma without complication Suspect flare of asthma due to recent allergen exposure. Start oral prednisone and albuterol inhaler. She has epi-pen on hand should she need it. Follow-up if any worsening symptoms or other concerns.  Other orders -     predniSONE (DELTASONE)  20 MG tablet; Take 2 tablets (40 mg total) by mouth daily. -     albuterol (VENTOLIN HFA) 108 (90 Base) MCG/ACT inhaler; Inhale 2 puffs into the lungs every 6 (six) hours as needed for wheezing or shortness of breath.    . Reviewed expectations re: course of current medical issues. . Discussed self-management of symptoms. . Outlined signs  and symptoms indicating need for more acute intervention. . Patient verbalized understanding and all questions were answered. Marland Kitchen Health Maintenance issues including appropriate healthy diet, exercise, and smoking avoidance were discussed with patient. . See orders for this visit as documented in the electronic medical record.  I discussed the assessment and treatment plan with the patient. The patient was provided an opportunity to ask questions and all were answered. The patient agreed with the plan and demonstrated an understanding of the instructions.   The patient was advised to call back or seek an in-person evaluation if the symptoms worsen or if the condition fails to improve as anticipated.   CMA or LPN served as scribe during this visit. History, Physical, and Plan performed by medical provider. The above documentation has been reviewed and is accurate and complete.   [place], [place] 08/12/2019

## 2019-08-15 ENCOUNTER — Telehealth (INDEPENDENT_AMBULATORY_CARE_PROVIDER_SITE_OTHER): Payer: No Typology Code available for payment source | Admitting: Family Medicine

## 2019-08-15 DIAGNOSIS — J4521 Mild intermittent asthma with (acute) exacerbation: Secondary | ICD-10-CM | POA: Diagnosis not present

## 2019-08-15 DIAGNOSIS — R0981 Nasal congestion: Secondary | ICD-10-CM

## 2019-08-15 NOTE — Progress Notes (Signed)
Virtual Visit via Video Note  I connected with Megan Petty  on 08/15/19 at  4:20 PM EDT by a video enabled telemedicine application and verified that I am speaking with the correct person using two identifiers.  Location patient: home, [place] Location provider:work or home office Persons participating in the virtual visit: patient, provider  I discussed the limitations of evaluation and management by telemedicine and the availability of in person appointments. The patient expressed understanding and agreed to proceed.   HPI:  Acute visit for sinus issues: -started about 5 days ago -symptoms: nasal congestion, cough, wheezing when started -has asthma, had asthma attack last weekend with this and saw PCP 6/28 -PCP gave her albuterol and prednisone -she has been using the inhaler once at night for SOB and it resolves with the inhaler -had negative COVID19 test 6/28 -asthma symptoms have improved a lot with the prednisone -still has sinus congestion and sinus pressure, has had a little yellow tinge to the nasal congestion -denies: fevers, NVD, body aches, persistent SOB, sinus pain -denies any known sick contacts -she is fully vaccinated for [place] with the J and J vaccine  ROS: See pertinent positives and negatives per HPI.  Past Medical History:  Diagnosis Date  . Allergy   . Asthma    seasonal  . Cancer ([place])    melanoma- left leg  . Colon polyps   . Diabetes mellitus without complication (HCC)    hx, not any longer  . Family history of breast cancer   . Family history of colon cancer   . Family history of ovarian cancer   . Family history of pancreatic cancer   . GERD (gastroesophageal reflux disease)   . H/O hiatal hernia    had surgery to fix with last surgery  . Hyperlipidemia   . Hypertension     Past Surgical History:  Procedure Laterality Date  . ABDOMINAL HYSTERECTOMY    . ABDOMINOPLASTY/PANNICULECTOMY WITH LIPOSUCTION N/A 12/31/2012   Procedure: PANNICULECTOMY  WITH LIPOSUCTION;  Surgeon: Cristine Polio, MD;  Location: [facility];  Service: Plastics;  Laterality: N/A;  . CESAREAN SECTION    . CHOLECYSTECTOMY N/A 11/03/2017   Procedure: LAPAROSCOPIC CHOLECYSTECTOMY;  Surgeon: Clovis Riley, MD;  Location: [facility];  Service: General;  Laterality: N/A;  . COLONOSCOPY    . HERNIA REPAIR    . LAPAROSCOPIC GASTRIC SLEEVE RESECTION    . LITHOTRIPSY    . MELANOMA EXCISION    . TONSILLECTOMY      Family History  Problem Relation Age of Onset  . Colon cancer Mother        gallbladder cancer spread to colon  . Cancer Mother 47       gallbladder  . Lung cancer Father 86  . Pancreatic cancer Brother 29       Maternal 1/2 brother  . Breast cancer Paternal Aunt 72  . Kidney cancer Maternal Grandmother   . Lung cancer Maternal Grandfather   . Breast cancer Paternal Grandmother        dx in her 69s  . Ovarian cancer Paternal Grandmother        dx in her 71s  . Esophageal cancer Neg Hx   . Rectal cancer Neg Hx   . Stomach cancer Neg Hx     SOCIAL HX: see hpi   Current Outpatient Medications:  .  albuterol (VENTOLIN HFA) 108 (90 Base) MCG/ACT inhaler, Inhale 2 puffs into the lungs every 6 (six) hours as needed for wheezing or  shortness of breath., Disp: 18 g, Rfl: 3 .  betamethasone valerate (VALISONE) 0.1 % cream, as needed. , Disp: , Rfl:  .  EPINEPHrine 0.3 mg/0.3 mL IJ SOAJ injection, Inject 0.3 mLs (0.3 mg total) into the muscle as needed for anaphylaxis., Disp: 2 each, Rfl: 1 .  predniSONE (DELTASONE) 20 MG tablet, Take 2 tablets (40 mg total) by mouth daily., Disp: 10 tablet, Rfl: 0  EXAM:  VITALS per patient if applicable:  GENERAL: alert, oriented, appears well and in no acute distress  HEENT: atraumatic, conjunttiva clear, no obvious abnormalities on inspection of external nose and ears  NECK: normal movements of the head and neck  LUNGS: on inspection no signs of respiratory distress, breathing rate appears normal, no obvious  gross SOB, gasping or wheezing  CV: no obvious cyanosis  MS: moves all visible extremities without noticeable abnormality  PSYCH/NEURO: pleasant and cooperative, no obvious depression or anxiety, speech and thought processing grossly intact  ASSESSMENT AND PLAN:  Discussed the following assessment and plan:  Sinus congestion  Mild intermittent asthma with exacerbation   -we discussed possible serious and likely etiologies, options for evaluation and workup, limitations of telemedicine visit vs in person visit, treatment, treatment risks and precautions. Pt prefers to treat via telemedicine empirically rather then risking or undertaking an in person visit at this moment. Suspect viral resp illness with asthma exacerbation. Seems to be improving with the prednisone and albuterol. Opted for completion of the prednisone alb prn, nasal saline twice daily, afrin nasal spray short course for three days. Patient agrees to seek prompt in person care if worsening, new symptoms arise, or if is not improving with treatment expected.   I discussed the assessment and treatment plan with the patient. The patient was provided an opportunity to ask questions and all were answered. The patient agreed with the plan and demonstrated an understanding of the instructions.   The patient was advised to call back or seek an in-person evaluation if the symptoms worsen or if the condition fails to improve as anticipated.   Megan Kern, DO

## 2019-08-15 NOTE — Patient Instructions (Signed)
INSTRUCTIONS FOR UPPER RESPIRATORY INFECTION:  -complete the prednisone  -albuterol as needed per instructions  -plenty of rest and fluids  -nasal saline wash 2-3 times daily (use prepackaged nasal saline or bottled/distilled water if making your own)   -can use AFRIN nasal spray for drainage and nasal congestion - but do NOT use longer then 3-4 days  -can use tylenol (in no history of liver disease) or ibuprofen (if no history of kidney disease, bowel bleeding or significant heart disease) as directed for aches and sorethroat  -in the winter time, using a humidifier at night is helpful (please follow cleaning instructions)  -if you are taking a cough medication - use only as directed, may also try a teaspoon of honey to coat the throat and throat lozenges. -for sore throat, salt water gargles can help  -follow up if you have fevers, facial pain, tooth pain, difficulty breathing or are worsening or symptoms persist longer then expected  Upper Respiratory Infection, Adult An upper respiratory infection (URI) is also known as the common cold. It is often caused by a type of germ (virus). Colds are easily spread (contagious). You can pass it to others by kissing, coughing, sneezing, or drinking out of the same glass. Usually, you get better in 1 to 3  weeks.  However, the cough can last for even longer. HOME CARE   Only take medicine as told by your doctor. Follow instructions provided above.  Drink enough water and fluids to keep your pee (urine) clear or pale yellow.  Get plenty of rest.  Return to work when your temperature is < 100 for 24 hours or as told by your doctor. You may use a face mask and wash your hands to stop your cold from spreading. GET HELP RIGHT AWAY IF:   After the first few days, you feel you are getting worse.  You have questions about your medicine.  You have chills, shortness of breath, or red spit (mucus).  You have pain in the face for more then 1-2  days, especially when you bend forward.  You have a fever, puffy (swollen) neck, pain when you swallow, or white spots in the back of your throat.  You have a bad headache, ear pain, sinus pain, or chest pain.  You have a high-pitched whistling sound when you breathe in and out (wheezing).  You cough up blood.  You have sore muscles or a stiff neck. MAKE SURE YOU:   Understand these instructions.  Will watch your condition.  Will get help right away if you are not doing well or get worse. Document Released: 07/20/2007 Document Revised: 04/25/2011 Document Reviewed: 05/08/2013 [facility] Patient Information 2015 [place], [place]. This information is not intended to replace advice given to you by your health care provider. Make sure you discuss any questions you have with your health care provider.

## 2019-12-04 ENCOUNTER — Encounter: Payer: Self-pay | Admitting: Physician Assistant

## 2019-12-04 ENCOUNTER — Telehealth (INDEPENDENT_AMBULATORY_CARE_PROVIDER_SITE_OTHER): Payer: No Typology Code available for payment source | Admitting: Physician Assistant

## 2019-12-04 DIAGNOSIS — J4521 Mild intermittent asthma with (acute) exacerbation: Secondary | ICD-10-CM

## 2019-12-04 MED ORDER — PREDNISONE 20 MG PO TABS: 40.0000 mg | ORAL_TABLET | Freq: Every day | ORAL | 0 refills | Status: DC

## 2019-12-04 MED ORDER — ALBUTEROL SULFATE HFA 108 (90 BASE) MCG/ACT IN AERS: 2.0000 | INHALATION_SPRAY | Freq: Four times a day (QID) | RESPIRATORY_TRACT | 3 refills | Status: DC | PRN

## 2019-12-04 NOTE — Progress Notes (Signed)
Virtual Visit via Video   I connected with Megan Petty on 12/04/19 at  1:00 PM EDT by a video enabled telemedicine application and verified that I am speaking with the correct person using two identifiers. Location patient: Home Location provider: [facility], Office Persons participating in the virtual visit: Nakia, Rem[place] PA-C  I discussed the limitations of evaluation and management by telemedicine and the availability of in person appointments. The patient expressed understanding and agreed to proceed.  Subjective:   HPI:   Husband had recent URI, with negative COVID-19 test last week. Over the weekend, she suspects that she developed an asthma exacerbation while cleaning out a garage, potentially exposed to mouse droppings.  Her symptoms started over the weekend and have worsened with time.  Vaccination status: J&J in April  Patient endorses the following symptoms: sinus pain, rhinorrhea, itchy watery eyes and dry cough  Patient denies the following symptoms: subjective fever, chest pain and myalgia, malaise  Treatments tried: antihistamines or hydration  Patient risk factors: Current ELFYB-01 risk of complications score: 3 Smoking status: KHRISTEN CHEYNEY  reports that she quit smoking about 10 years ago. She has never used smokeless tobacco. If female, currently pregnant? []   Yes [x]   No  ROS: See pertinent positives and negatives per HPI.  Patient Active Problem List   Diagnosis Date Noted  . 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 Endometriosis 12/25/2018  . Obesity (BMI 30.0-34.9) 09/10/2018  . Mild intermittent asthma without complication 75/11/2583  . History of cholecystectomy 12/10/2017  . Kidney stone 09/08/2014  . Colon polyps   . History of laparoscopic partial gastrectomy 02/17/2011    Social History   Tobacco Use  . Smoking status: Former Smoker    Quit date: 02/14/2009    Years since quitting: 10.8  . Smokeless tobacco: Never  Used  Substance Use Topics  . Alcohol use: No    Current Outpatient Medications:  .  albuterol (VENTOLIN HFA) 108 (90 Base) MCG/ACT inhaler, Inhale 2 puffs into the lungs every 6 (six) hours as needed for wheezing or shortness of breath., Disp: 18 g, Rfl: 3 .  betamethasone valerate (VALISONE) 0.1 % cream, as needed. , Disp: , Rfl:  .  EPINEPHrine 0.3 mg/0.3 mL IJ SOAJ injection, Inject 0.3 mLs (0.3 mg total) into the muscle as needed for anaphylaxis., Disp: 2 each, Rfl: 1 .  predniSONE (DELTASONE) 20 MG tablet, Take 2 tablets (40 mg total) by mouth daily., Disp: 10 tablet, Rfl: 0  Allergies  Allergen Reactions  . Vicodin [Hydrocodone-Acetaminophen] Itching    Objective:   VITALS: Per patient if applicable, see vitals. GENERAL: Alert, appears well and in no acute distress. HEENT: Atraumatic, conjunctiva clear, no obvious abnormalities on inspection of external nose and ears. NECK: Normal movements of the head and neck. CARDIOPULMONARY: No increased WOB. Speaking in clear sentences. I:E ratio WNL.  MS: Moves all visible extremities without noticeable abnormality. PSYCH: Pleasant and cooperative, well-groomed. Speech normal rate and rhythm. Affect is appropriate. Insight and judgement are appropriate. Attention is focused, linear, and appropriate.  NEURO: CN grossly intact. Oriented as arrived to appointment on time with no prompting. Moves both UE equally.  SKIN: No obvious lesions, wounds, erythema, or cyanosis noted on face or hands.  Assessment and Plan:   Diagnoses and all orders for this visit:  Mild intermittent asthma with exacerbation  Other orders -     albuterol (VENTOLIN HFA) 108 (90 Base) MCG/ACT inhaler; Inhale 2  puffs into the lungs every 6 (six) hours as needed for wheezing or shortness of breath. -     predniSONE (DELTASONE) 20 MG tablet; Take 2 tablets (40 mg total) by mouth daily.   No red flags on discussion, patient is not in any obvious distress during our  visit. Discussed progression of most viral illnesses, and recommended supportive care at this point in time. Will treat asthma exacerbation with albuterol inhaler and prednisone burst. Discussed over the counter supportive care options, with recommendations to push fluids and rest. Reviewed return precautions including new/worsening fever, SOB, new/worsening cough or other concerns.  Recommended need to self-quarantine and practice social distancing until symptoms resolve. Discussed current recommendations for COVID testing. I recommend that patient follow-up if symptoms worsen or persist despite treatment x 7-10 days, sooner if needed.  I discussed the assessment and treatment plan with the patient. The patient was provided an opportunity to ask questions and all were answered. The patient agreed with the plan and demonstrated an understanding of the instructions.   The patient was advised to call back or seek an in-person evaluation if the symptoms worsen or if the condition fails to improve as anticipated.   CMA or LPN served as scribe during this visit. History, Physical, and Plan performed by medical provider. The above documentation has been reviewed and is accurate and complete.   [place], [place] 12/04/2019

## 2020-05-06 ENCOUNTER — Other Ambulatory Visit: Payer: Self-pay | Admitting: Obstetrics and Gynecology

## 2020-05-06 DIAGNOSIS — Z803 Family history of malignant neoplasm of breast: Secondary | ICD-10-CM

## 2020-07-08 IMAGING — DX DG CHEST 2V
2 series · 2 of 2 positions shown · non-contrast
Comparison: 12/21/2012

CLINICAL DATA: Chest pain

EXAM:
CHEST - 2 VIEW

[chest pa]
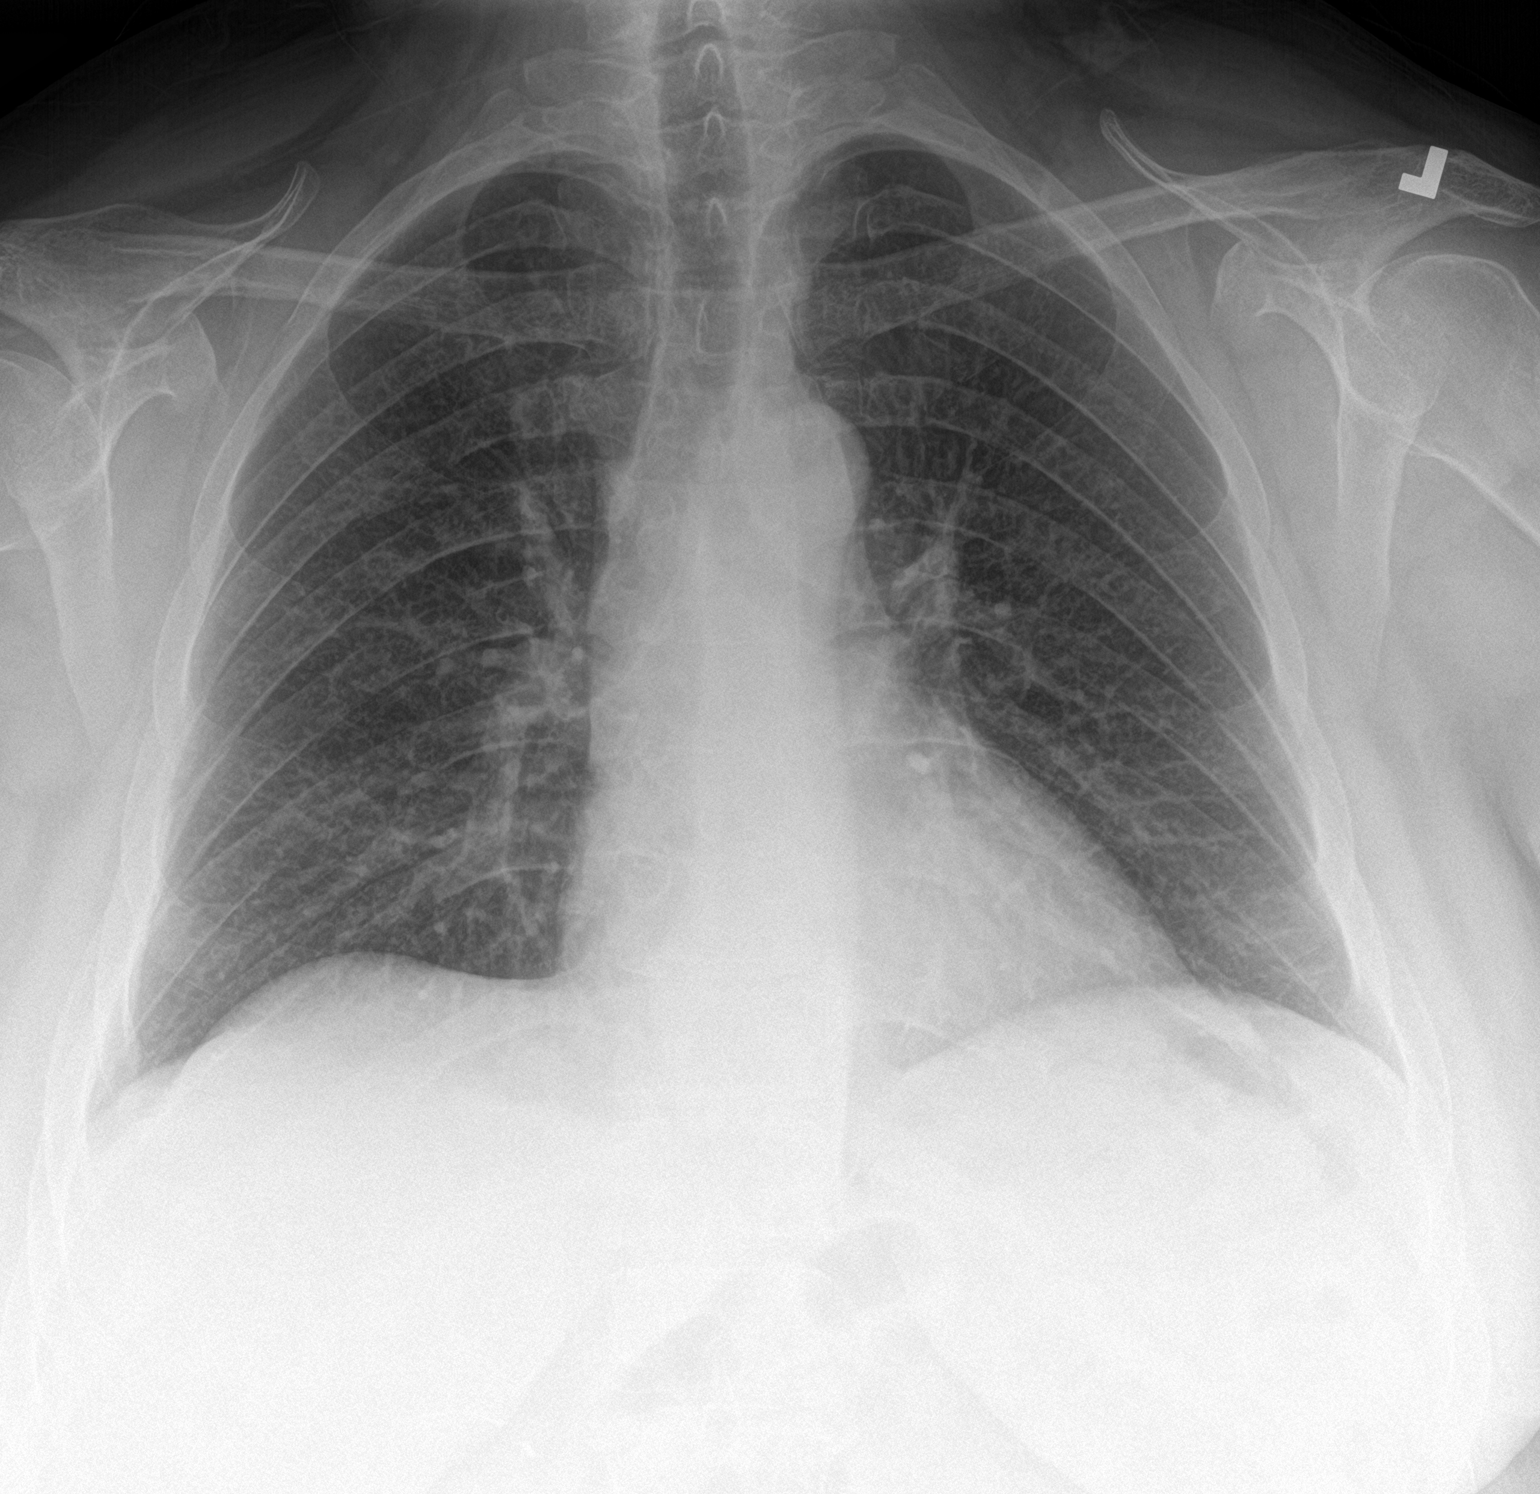

[chest lat]
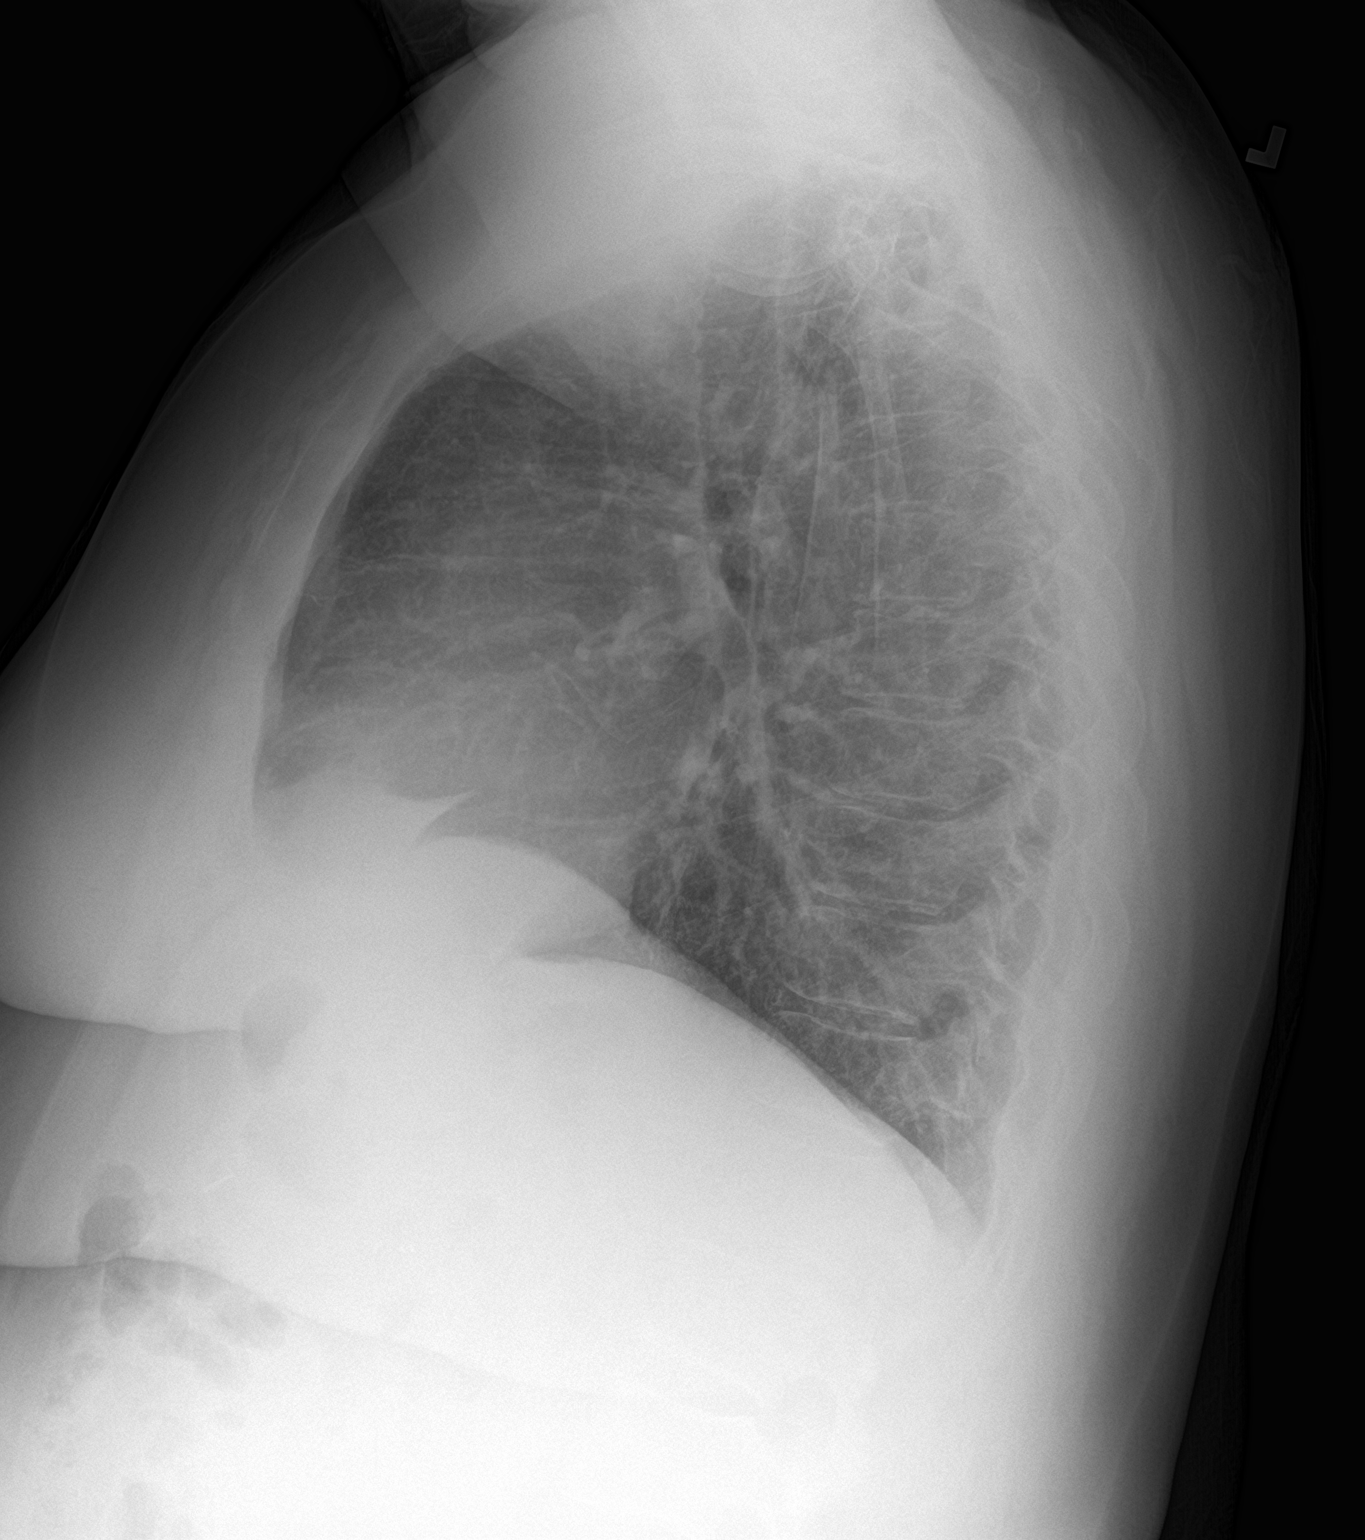

[2 of 2 positions shown; findings below may reference images not displayed]

FINDINGS: Heart and mediastinal contours are within normal limits. No focal
opacities or effusions. No acute bony abnormality.
IMPRESSION: No active cardiopulmonary disease.

## 2020-09-30 ENCOUNTER — Encounter: Payer: Self-pay | Admitting: Physician Assistant

## 2020-09-30 ENCOUNTER — Ambulatory Visit (INDEPENDENT_AMBULATORY_CARE_PROVIDER_SITE_OTHER): Payer: No Typology Code available for payment source | Admitting: Physician Assistant

## 2020-09-30 ENCOUNTER — Other Ambulatory Visit: Payer: Self-pay

## 2020-09-30 VITALS — BP 110/78 | HR 72 | Temp 97.8°F | Ht 68.0 in | Wt 229.5 lb

## 2020-09-30 DIAGNOSIS — E669 Obesity, unspecified: Secondary | ICD-10-CM

## 2020-09-30 DIAGNOSIS — Z903 Acquired absence of stomach [part of]: Secondary | ICD-10-CM

## 2020-09-30 DIAGNOSIS — R1319 Other dysphagia: Secondary | ICD-10-CM | POA: Diagnosis not present

## 2020-09-30 LAB — LIPID PANEL
Cholesterol: 164 mg/dL (ref 0–200)
HDL: 80.2 mg/dL (ref >39.00)
LDL Cholesterol: 74 mg/dL (ref 0–99)
NonHDL: 84.04
Total CHOL/HDL Ratio: 2
Triglycerides: 50 mg/dL (ref 0.0–149.0)
VLDL: 10 mg/dL (ref 0.0–40.0)

## 2020-09-30 LAB — CBC WITH DIFFERENTIAL/PLATELET
Basophils Absolute: 0 10*3/uL (ref 0.0–0.1)
Basophils Relative: 0.9 % (ref 0.0–3.0)
Eosinophils Absolute: 0.1 10*3/uL (ref 0.0–0.7)
Eosinophils Relative: 2.1 % (ref 0.0–5.0)
HCT: 41.3 % (ref 36.0–46.0)
Hemoglobin: 13.8 g/dL (ref 12.0–15.0)
Lymphocytes Relative: 34.4 % (ref 12.0–46.0)
Lymphs Abs: 1.5 10*3/uL (ref 0.7–4.0)
MCHC: 33.4 g/dL (ref 30.0–36.0)
MCV: 88.5 fl (ref 78.0–100.0)
Monocytes Absolute: 0.5 10*3/uL (ref 0.1–1.0)
Monocytes Relative: 11.1 % (ref 3.0–12.0)
Neutro Abs: 2.3 10*3/uL (ref 1.4–7.7)
Neutrophils Relative %: 51.5 % (ref 43.0–77.0)
Platelets: 207 10*3/uL (ref 150.0–400.0)
RBC: 4.67 Mil/uL (ref 3.87–5.11)
RDW: 13.1 % (ref 11.5–15.5)
WBC: 4.4 10*3/uL (ref 4.0–10.5)

## 2020-09-30 LAB — COMPREHENSIVE METABOLIC PANEL
ALT: 13 U/L (ref 0–35)
AST: 14 U/L (ref 0–37)
Albumin: 4 g/dL (ref 3.5–5.2)
Alkaline Phosphatase: 87 U/L (ref 39–117)
BUN: 23 mg/dL (ref 6–23)
CO2: 27 mEq/L (ref 19–32)
Calcium: 9.2 mg/dL (ref 8.4–10.5)
Chloride: 106 mEq/L (ref 96–112)
Creatinine, Ser: 1 mg/dL (ref 0.40–1.20)
GFR: 62.03 mL/min (ref >60.00)
Glucose, Bld: 93 mg/dL (ref 70–99)
Potassium: 4.4 mEq/L (ref 3.5–5.1)
Sodium: 141 mEq/L (ref 135–145)
Total Bilirubin: 0.5 mg/dL (ref 0.2–1.2)
Total Protein: 6.8 g/dL (ref 6.0–8.3)

## 2020-09-30 LAB — VITAMIN B12: Vitamin B-12: 351 pg/mL (ref 211–911)

## 2020-09-30 LAB — VITAMIN D 25 HYDROXY (VIT D DEFICIENCY, FRACTURES): VITD: 32.33 ng/mL (ref 30.00–100.00)

## 2020-09-30 NOTE — Progress Notes (Signed)
Megan Petty is a 60 y.o. female here for a follow up of a pre-existing problem.  I acted as a Education administrator for [employer], PA-C [employer], LPN   History of Present Illness:   Chief Complaint  Patient presents with   Heartburn    HPI  Dysphagia Pt c/o mid-sternal esophageal discomfort for the past few months, has been getting worse the past month happening everyday when she eats. She is having epigastric burning and specifically difficulty with proteins -- beef, chicken, etc. She can feel these foods pass through. Denies nausea, vomiting, changes in stools. Pt has not tried any medication.  Last colonoscopy 02/01/18 -- has personal hx of adenomatous polyps and family hx of colon CA in first degree relative. Due for repeat colonoscopy this December (3 year recall)  No significant fam hx of esophageal CA. Former very light smoker. No current ETOH. States that last year her sister required esophageal dilatation.  Hx of gastric surgery Overdue for blood work. She admittedly does not take any vitamins/minerals. States that she took them after the first year but levels were normal so she stopped taking them. Follows a mostly keto diet.    Past Medical History:  Diagnosis Date   Allergy    Asthma    seasonal   Cancer ([place])    melanoma- left leg   Colon polyps    Diabetes mellitus without complication (HCC)    hx, not any longer   Family history of breast cancer    Family history of colon cancer    Family history of ovarian cancer    Family history of pancreatic cancer    GERD (gastroesophageal reflux disease)    H/O hiatal hernia    had surgery to fix with last surgery   Hyperlipidemia    Hypertension      Social History   Tobacco Use   Smoking status: Former    Packs/day: 0.13    Years: 10.00    Pack years: 1.30    Types: Cigarettes    Quit date: 02/14/2009    Years since quitting: 11.6   Smokeless tobacco: Never  Vaping Use   Vaping Use: Never used   Substance Use Topics   Alcohol use: No   Drug use: No    Past Surgical History:  Procedure Laterality Date   ABDOMINAL HYSTERECTOMY     ABDOMINOPLASTY/PANNICULECTOMY WITH LIPOSUCTION N/A 12/31/2012   Procedure: PANNICULECTOMY WITH LIPOSUCTION;  Surgeon: Cristine Polio, MD;  Location: [facility];  Service: Plastics;  Laterality: N/A;   CESAREAN SECTION     CHOLECYSTECTOMY N/A 11/03/2017   Procedure: LAPAROSCOPIC CHOLECYSTECTOMY;  Surgeon: Clovis Riley, MD;  Location: [facility];  Service: General;  Laterality: N/A;   COLONOSCOPY     HERNIA REPAIR     LAPAROSCOPIC GASTRIC SLEEVE RESECTION     LITHOTRIPSY     MELANOMA EXCISION     TONSILLECTOMY      Family History  Problem Relation Age of Onset   Colon cancer Mother        gallbladder cancer spread to colon   Cancer Mother 37       gallbladder   Lung cancer Father 14   Pancreatic cancer Brother 30       Maternal 1/2 brother   Breast cancer Paternal Aunt 63   Kidney cancer Maternal Grandmother    Lung cancer Maternal Grandfather    Breast cancer Paternal Grandmother        dx in her 53s  Ovarian cancer Paternal Grandmother        dx in her 74s   Esophageal cancer Neg Hx    Rectal cancer Neg Hx    Stomach cancer Neg Hx     Allergies  Allergen Reactions   Vicodin [Hydrocodone-Acetaminophen] Itching    Current Medications:   Current Outpatient Medications:    albuterol (VENTOLIN HFA) 108 (90 Base) MCG/ACT inhaler, Inhale 2 puffs into the lungs every 6 (six) hours as needed for wheezing or shortness of breath., Disp: 18 g, Rfl: 3   betamethasone valerate (VALISONE) 0.1 % cream, as needed. , Disp: , Rfl:    EPINEPHrine 0.3 mg/0.3 mL IJ SOAJ injection, Inject 0.3 mLs (0.3 mg total) into the muscle as needed for anaphylaxis., Disp: 2 each, Rfl: 1   Lidocaine, Anorectal, 5 % CREA, lidocaine 5 % topical cream  Apply 1 application by topical route., Disp: , Rfl:    Review of Systems:   ROS Negative unless otherwise  specified per HPI.  Vitals:   Vitals:   09/30/20 0803  BP: 110/78  Pulse: 72  Temp: 97.8 F (36.6 C)  TempSrc: Temporal  SpO2: 97%  Weight: 229 lb 8 oz (104.1 kg)  Height: '5\' 8"'$  (1.727 m)     Body mass index is 34.9 kg/m.  Physical Exam:   Physical Exam Vitals and nursing note reviewed.  Constitutional:      General: She is not in acute distress.    Appearance: She is well-developed. She is not ill-appearing or toxic-appearing.  Cardiovascular:     Rate and Rhythm: Normal rate and regular rhythm.     Pulses: Normal pulses.     Heart sounds: Normal heart sounds, S1 normal and S2 normal.     Comments: No LE edema Pulmonary:     Effort: Pulmonary effort is normal.     Breath sounds: Normal breath sounds.  Abdominal:     General: Abdomen is flat. Bowel sounds are normal.     Palpations: Abdomen is soft.     Tenderness: There is no abdominal tenderness.  Skin:    General: Skin is warm and dry.  Neurological:     Mental Status: She is alert.     GCS: GCS eye subscore is 4. GCS verbal subscore is 5. GCS motor subscore is 6.  Psychiatric:        Speech: Speech normal.        Behavior: Behavior normal. Behavior is cooperative.      Assessment and Plan:   Jesse was seen today for heartburn.  Diagnoses and all orders for this visit:  Esophageal dysphagia Referral to GI, also due for colonoscopy in Dec Recommend avoiding difficult to chew foods Cut foods very well and take time eating Hydrate well Choking precautions advised -     CBC with Differential/Platelet -     Comprehensive metabolic panel -     Ambulatory referral to Gastroenterology  History of laparoscopic partial gastrectomy; Obesity (BMI 30.0-34.9) Encouraged compliance with bariatric vitamins Update blood work today and provide further recommendations accordingly -     CBC with Differential/Platelet -     Comprehensive metabolic panel -     Lipid panel -     Iron, TIBC and Ferritin Panel -      VITAMIN D 25 Hydroxy (Vit-D Deficiency, Fractures) -     Vitamin B12 -     Ambulatory referral to Gastroenterology  CMA or LPN served as scribe during this visit. History, Physical,  and Plan performed by medical provider. The above documentation has been reviewed and is accurate and complete.  Inda Coke, PA-C

## 2020-09-30 NOTE — Patient Instructions (Signed)
It was great to see you!  Update labs today  Keep your meats well cooked and well cut-up, chew thoroughly  GI referral placed today  If a referral was placed today, you will be contacted for an appointment. Please note that routine referrals can sometimes take up to 3-4 weeks to process. Please call our office if you haven't heard anything after this time frame.  Take care,  Inda Coke PA-C

## 2020-10-01 LAB — IRON,TIBC AND FERRITIN PANEL
%SAT: 24 % (calc) (ref 16–45)
Ferritin: 61 ng/mL (ref 16–232)
Iron: 82 ug/dL (ref 45–160)
TIBC: 338 mcg/dL (calc) (ref 250–450)

## 2020-11-17 ENCOUNTER — Encounter: Payer: Self-pay | Admitting: Physician Assistant

## 2020-11-17 ENCOUNTER — Telehealth (INDEPENDENT_AMBULATORY_CARE_PROVIDER_SITE_OTHER): Payer: No Typology Code available for payment source | Admitting: Physician Assistant

## 2020-11-17 VITALS — Ht 68.0 in | Wt 229.0 lb

## 2020-11-17 DIAGNOSIS — U071 COVID-19: Secondary | ICD-10-CM | POA: Diagnosis not present

## 2020-11-17 MED ORDER — PREDNISONE 20 MG PO TABS: 40.0000 mg | ORAL_TABLET | Freq: Every day | ORAL | 0 refills | Status: DC

## 2020-11-17 MED ORDER — NIRMATRELVIR/RITONAVIR (PAXLOVID)TABLET: 3.0000 | ORAL_TABLET | Freq: Two times a day (BID) | ORAL | 0 refills | Status: AC

## 2020-11-17 MED ORDER — ALBUTEROL SULFATE HFA 108 (90 BASE) MCG/ACT IN AERS: 2.0000 | INHALATION_SPRAY | Freq: Four times a day (QID) | RESPIRATORY_TRACT | 3 refills | Status: DC | PRN

## 2020-11-17 NOTE — Progress Notes (Signed)
Virtual Visit via Video   I connected with Megan Petty on 11/17/20 at  4:00 PM EDT by a video enabled telemedicine application and verified that I am speaking with the correct person using two identifiers. Location patient: Home Location provider: [facility], Office Persons participating in the virtual visit: [place], Eckart PA-C, Anselmo Pickler, LPN   I discussed the limitations of evaluation and management by telemedicine and the availability of in person appointments. The patient expressed understanding and agreed to proceed.  I acted as a Education administrator for [employer], PA-C [employer], LPN   Subjective:   HPI:   Patient is requesting evaluation for possible COVID-19.  Symptom onset: Sunday  Travel/contacts: Pt traveled last week to [facility]   Vaccination status: J & J 05/2019 and J & J Booster 02/2020  Testing results: Home COVID test this morning positive  Patient endorses the following symptoms: Headache, diarrhea, cough, fever Sunday night has resolved, body aches  Patient denies the following symptoms: Chest pain or SOB, inadequate fluid intake  Treatments tried: Ibuprofen and Tylenol alternating  Patient risk factors: Current QBHAL-93 risk of complications score: 3 Smoking status: Megan Petty  reports that she quit smoking about 11 years ago. Her smoking use included cigarettes. She has a 1.30 pack-year smoking history. She has never used smokeless tobacco. If female, currently pregnant? []   Yes [x]   No  ROS: See pertinent positives and negatives per HPI.  Patient Active Problem List   Diagnosis Date Noted   Allergy to ant bite 04/19/2019   Endometriosis 12/25/2018   Obesity (BMI 30.0-34.9) 09/10/2018   Mild intermittent asthma without complication 79/03/4095   History of cholecystectomy 12/10/2017   Kidney stone 09/08/2014   Colon polyps    History of laparoscopic partial gastrectomy 02/17/2011    Social History    Tobacco Use   Smoking status: Former    Packs/day: 0.13    Years: 10.00    Pack years: 1.30    Types: Cigarettes    Quit date: 02/14/2009    Years since quitting: 11.7   Smokeless tobacco: Never  Substance Use Topics   Alcohol use: No    Current Outpatient Medications:    betamethasone valerate (VALISONE) 0.1 % cream, as needed. , Disp: , Rfl:    EPINEPHrine 0.3 mg/0.3 mL IJ SOAJ injection, Inject 0.3 mLs (0.3 mg total) into the muscle as needed for anaphylaxis., Disp: 2 each, Rfl: 1   Lidocaine, Anorectal, 5 % CREA, lidocaine 5 % topical cream  Apply 1 application by topical route., Disp: , Rfl:    nirmatrelvir/ritonavir EUA (PAXLOVID) 20 x 150 MG & 10 x 100MG  TABS, Take 3 tablets by mouth 2 (two) times daily for 5 days. (Take nirmatrelvir 150 mg two tablets twice daily for 5 days and ritonavir 100 mg one tablet twice daily for 5 days) Patient GFR is 62, Disp: 30 tablet, Rfl: 0   predniSONE (DELTASONE) 20 MG tablet, Take 2 tablets (40 mg total) by mouth daily., Disp: 10 tablet, Rfl: 0   albuterol (VENTOLIN HFA) 108 (90 Base) MCG/ACT inhaler, Inhale 2 puffs into the lungs every 6 (six) hours as needed for wheezing or shortness of breath., Disp: 18 g, Rfl: 3  Allergies  Allergen Reactions   Vicodin [Hydrocodone-Acetaminophen] Itching    Objective:   VITALS: Per patient if applicable, see vitals. GENERAL: Alert, appears well and in no acute distress. HEENT: Atraumatic, conjunctiva clear, no obvious abnormalities on inspection of external  nose and ears. NECK: Normal movements of the head and neck. CARDIOPULMONARY: No increased WOB. Speaking in clear sentences. I:E ratio WNL.  MS: Moves all visible extremities without noticeable abnormality. PSYCH: Pleasant and cooperative, well-groomed. Speech normal rate and rhythm. Affect is appropriate. Insight and judgement are appropriate. Attention is focused, linear, and appropriate.  NEURO: CN grossly intact. Oriented as arrived to  appointment on time with no prompting. Moves both UE equally.  SKIN: No obvious lesions, wounds, erythema, or cyanosis noted on face or hands.  Assessment and Plan:   Megan Petty was seen today for covid positive.  Diagnoses and all orders for this visit:  COVID-19  Other orders -     nirmatrelvir/ritonavir EUA (PAXLOVID) 20 x 150 MG & 10 x 100MG  TABS; Take 3 tablets by mouth 2 (two) times daily for 5 days. (Take nirmatrelvir 150 mg two tablets twice daily for 5 days and ritonavir 100 mg one tablet twice daily for 5 days) Patient GFR is 62 -     predniSONE (DELTASONE) 20 MG tablet; Take 2 tablets (40 mg total) by mouth daily. -     albuterol (VENTOLIN HFA) 108 (90 Base) MCG/ACT inhaler; Inhale 2 puffs into the lungs every 6 (six) hours as needed for wheezing or shortness of breath.  No red flags on discussion, patient is not in any obvious distress during our visit. History of asthma -- currently well controlled -- will refill albuterol inhaler for prn use and start oral prednisone to help prevent exacerbation in setting of COVID-19.  Patient is interested in [facility]. I have sent this in for patient and also discussed that this medication is still under the FDA EUA and full long-term side effect profile is unknown.  Benefits/risks of medication discussed.  Patient currently has no contraindications for taking this medication.  We reviewed current medications and most recent GFR.  They are aware of risks of medication and wishes to proceed. I advised that this medication has numerous potential drug interactions and they should confirm with their pharmacist that this medication is safe to take with their current prescriptions prior to starting.  Discussed over the counter supportive care options, including Tylenol 500 mg q 8 hours, with recommendations to push fluids and rest. Reviewed return precautions including new/worsening fever, SOB, new/worsening cough, sudden onset changes of  symptoms. Recommended need to self-quarantine and practice social distancing until symptoms resolve. I recommend that patient follow-up if symptoms worsen or persist despite treatment x 7-10 days, sooner if needed.  I discussed the assessment and treatment plan with the patient. The patient was provided an opportunity to ask questions and all were answered. The patient agreed with the plan and demonstrated an understanding of the instructions.   The patient was advised to call back or seek an in-person evaluation if the symptoms worsen or if the condition fails to improve as anticipated.   CMA or LPN served as scribe during this visit. History, Physical, and Plan performed by medical provider. The above documentation has been reviewed and is accurate and complete.   Inda Coke, [place] 11/17/2020

## 2020-11-18 ENCOUNTER — Encounter: Payer: Self-pay | Admitting: Physician Assistant

## 2020-12-15 ENCOUNTER — Encounter: Payer: Self-pay | Admitting: Physician Assistant

## 2020-12-15 ENCOUNTER — Ambulatory Visit (INDEPENDENT_AMBULATORY_CARE_PROVIDER_SITE_OTHER): Payer: No Typology Code available for payment source | Admitting: Physician Assistant

## 2020-12-15 VITALS — BP 110/80 | HR 80 | Ht 67.0 in | Wt 235.2 lb

## 2020-12-15 DIAGNOSIS — R1310 Dysphagia, unspecified: Secondary | ICD-10-CM

## 2020-12-15 DIAGNOSIS — Z86010 Personal history of colonic polyps: Secondary | ICD-10-CM | POA: Diagnosis not present

## 2020-12-15 DIAGNOSIS — Z860101 Personal history of adenomatous and serrated colon polyps: Secondary | ICD-10-CM

## 2020-12-15 DIAGNOSIS — Z903 Acquired absence of stomach [part of]: Secondary | ICD-10-CM

## 2020-12-15 DIAGNOSIS — K219 Gastro-esophageal reflux disease without esophagitis: Secondary | ICD-10-CM

## 2020-12-15 MED ORDER — PANTOPRAZOLE SODIUM 40 MG PO TBEC: 40.0000 mg | DELAYED_RELEASE_TABLET | Freq: Every day | ORAL | 2 refills | Status: DC

## 2020-12-15 MED ORDER — NA SULFATE-K SULFATE-MG SULF 17.5-3.13-1.6 GM/177ML PO SOLN: 1.0000 | Freq: Once | ORAL | 0 refills | Status: AC

## 2020-12-15 NOTE — Progress Notes (Signed)
Addendum: Reviewed and agree with assessment and management plan. Peyson Delao M, MD  

## 2020-12-15 NOTE — Progress Notes (Signed)
Chief Complaint: Dysphagia, GERD, history of adenomatous polyp  HPI:    Megan Petty is a 60 year old Caucasian female with a past medical history as listed below including diabetes and reflux as well as previous sleeve gastrectomy in 2012, known to Dr. Hilarie Fredrickson, who was referred to me by Inda Coke, [place] for a complaint of dysphagia.    02/01/2018 colonoscopy with perianal skin tags, one 3 mm polyp in the ascending colon, one 4 mm polyp in the transverse colon, two 4-5 mm polyps in the descending colon, diverticulosis in the sigmoid colon and internal hemorrhoids.  Pathology showed tubular adenomas and repeat recommended in 3 years.    Today, the patient tells me that she has had a feeling of food and even sometimes water getting stuck on its way down her throat, which has worsened since August but was probably going on before that.  It has become more constant and happens almost every time she eats and sometimes it is somewhat painful to the point where she has to kind of shrug her shoulders to help it go away.  Denies any regurgitation or vomiting.  Reminds me of her previous gastric surgery.  Does have intermittent reflux symptoms "if I eat one bite too much".    Denies fever, chills, weight loss, blood in her stool, change in bowel habits or abdominal pain.  Past Medical History:  Diagnosis Date   Allergy    Asthma    seasonal   Cancer ([place])    melanoma- left leg   Colon polyps    Diabetes mellitus without complication (HCC)    hx, not any longer   Family history of breast cancer    Family history of colon cancer    Family history of ovarian cancer    Family history of pancreatic cancer    GERD (gastroesophageal reflux disease)    H/O hiatal hernia    had surgery to fix with last surgery   Hyperlipidemia    Hypertension     Past Surgical History:  Procedure Laterality Date   ABDOMINAL HYSTERECTOMY     ABDOMINOPLASTY/PANNICULECTOMY WITH LIPOSUCTION N/A 12/31/2012    Procedure: PANNICULECTOMY WITH LIPOSUCTION;  Surgeon: Cristine Polio, MD;  Location: [facility];  Service: Plastics;  Laterality: N/A;   CESAREAN SECTION     CHOLECYSTECTOMY N/A 11/03/2017   Procedure: LAPAROSCOPIC CHOLECYSTECTOMY;  Surgeon: Clovis Riley, MD;  Location: [facility];  Service: General;  Laterality: N/A;   COLONOSCOPY     HERNIA REPAIR     LAPAROSCOPIC GASTRIC SLEEVE RESECTION     LITHOTRIPSY     MELANOMA EXCISION     TONSILLECTOMY      Current Outpatient Medications  Medication Sig Dispense Refill   albuterol (VENTOLIN HFA) 108 (90 Base) MCG/ACT inhaler Inhale 2 puffs into the lungs every 6 (six) hours as needed for wheezing or shortness of breath. 18 g 3   betamethasone valerate (VALISONE) 0.1 % cream as needed.      Lidocaine, Anorectal, 5 % CREA lidocaine 5 % topical cream  Apply 1 application by topical route.     EPINEPHrine 0.3 mg/0.3 mL IJ SOAJ injection Inject 0.3 mLs (0.3 mg total) into the muscle as needed for anaphylaxis. (Patient not taking: Reported on 12/15/2020) 2 each 1   No current facility-administered medications for this visit.    Allergies as of 12/15/2020 - Review Complete 12/15/2020  Allergen Reaction Noted   Vicodin [hydrocodone-acetaminophen] Itching 12/31/2012    Family History  Problem Relation Age  of Onset   Colon cancer Mother        gallbladder cancer spread to colon   Cancer Mother 57       gallbladder   Lung cancer Father 60   Pancreatic cancer Brother 31       Maternal 1/2 brother   Breast cancer Paternal Aunt 97   Kidney cancer Maternal Grandmother    Lung cancer Maternal Grandfather    Breast cancer Paternal Grandmother        dx in her 18s   Ovarian cancer Paternal Grandmother        dx in her 58s   Esophageal cancer Neg Hx    Rectal cancer Neg Hx    Stomach cancer Neg Hx     Social History   Socioeconomic History   Marital status: Married    Spouse name: Not on file   Number of children: 2   Years of education: Not  on file   Highest education level: Not on file  Occupational History   Not on file  Tobacco Use   Smoking status: Former    Packs/day: 0.13    Years: 10.00    Pack years: 1.30    Types: Cigarettes    Quit date: 02/14/2009    Years since quitting: 11.8   Smokeless tobacco: Never  Vaping Use   Vaping Use: Never used  Substance and Sexual Activity   Alcohol use: No   Drug use: No   Sexual activity: Not on file  Other Topics Concern   Not on file  Social History [employer] examiner of [place]   30+ marriage; two children   94 yo granddaughter   Social Determinants of Health   Financial Resource Strain: Not on file  Food Insecurity: Not on file  Transportation Needs: Not on file  Physical Activity: Not on file  Stress: Not on file  Social Connections: Not on file  Intimate Partner Violence: Not on file    Review of Systems:    Constitutional: No weight loss, fever or chills Skin: No rash  Cardiovascular: No chest pain Respiratory: No SOB  Gastrointestinal: See HPI and otherwise negative Genitourinary: No dysuria Neurological: No headache, dizziness or syncope Musculoskeletal: No new muscle or joint pain Hematologic: No bleeding Psychiatric: No history of depression or anxiety   Physical Exam:  Vital signs: BP 110/80 (BP Location: Left Arm, Patient Position: Sitting, Cuff Size: Normal)   Pulse 80   Ht 5\' 7"  (1.702 m) Comment: height measured without shoes  Wt 235 lb 4 oz (106.7 kg)   BMI 36.85 kg/m   Constitutional:   Pleasant overweight Caucasian female appears to be in NAD, Well developed, Well nourished, alert and cooperative Head:  Normocephalic and atraumatic. Eyes:   PEERL, EOMI. No icterus. Conjunctiva pink. Ears:  Normal auditory acuity. Neck:  Supple Throat: Oral cavity and pharynx without inflammation, swelling or lesion.  Respiratory: Respirations even and unlabored. Lungs clear to auscultation bilaterally.   No wheezes, crackles, or rhonchi.   Cardiovascular: Normal S1, S2. No MRG. Regular rate and rhythm. No peripheral edema, cyanosis or pallor.  Gastrointestinal:  Soft, nondistended, nontender. No rebound or guarding. Normal bowel sounds. No appreciable masses or hepatomegaly. Rectal:  Not performed.  Msk:  Symmetrical without gross deformities. Without edema, no deformity or joint abnormality.  Neurologic:  Alert and  oriented x4;  grossly normal neurologically.  Skin:   Dry and intact without significant lesions or rashes. Psychiatric: Demonstrates good judgement and  reason without abnormal affect or behaviors.  RELEVANT LABS AND IMAGING: CBC    Component Value Date/Time   WBC 4.4 09/30/2020 0824   RBC 4.67 09/30/2020 0824   HGB 13.8 09/30/2020 0824   HGB 13.8 07/06/2018 0922   HCT 41.3 09/30/2020 0824   PLT 207.0 09/30/2020 0824   PLT 203 07/06/2018 0922   MCV 88.5 09/30/2020 0824   MCH 29.5 07/06/2018 0922   MCHC 33.4 09/30/2020 0824   RDW 13.1 09/30/2020 0824   LYMPHSABS 1.5 09/30/2020 0824   MONOABS 0.5 09/30/2020 0824   EOSABS 0.1 09/30/2020 0824   BASOSABS 0.0 09/30/2020 0824    CMP     Component Value Date/Time   NA 141 09/30/2020 0824   K 4.4 09/30/2020 0824   CL 106 09/30/2020 0824   CO2 27 09/30/2020 0824   GLUCOSE 93 09/30/2020 0824   BUN 23 09/30/2020 0824   CREATININE 1.00 09/30/2020 0824   CREATININE 0.87 07/06/2018 0922   CREATININE 0.89 06/09/2017 1438   CALCIUM 9.2 09/30/2020 0824   PROT 6.8 09/30/2020 0824   ALBUMIN 4.0 09/30/2020 0824   AST 14 09/30/2020 0824   AST 14 (L) 07/06/2018 0922   ALT 13 09/30/2020 0824   ALT 11 07/06/2018 0922   ALKPHOS 87 09/30/2020 0824   BILITOT 0.5 09/30/2020 0824   BILITOT 0.4 07/06/2018 0922   GFRNONAA >60 07/06/2018 0922   GFRAA >60 07/06/2018 4132    Assessment: 1.  Dysphagia: Worse over the past 3 months, "feels" food and water going down, no regurgitation, intermittent reflux, previous gastric sleeve; consider stricture versus  complications from sleeve reverse esophagitis versus other 2.  History of gastric sleeve surgery: In 2012 3.  History of adenomatous polyps: Last colonoscopy in 2019 with recommendations for repeat in 3 years 4.  GERD: Occasional symptoms if she eats too much  Plan: 1.  Started the patient on Pantoprazole 40 mg once daily, 30-60 minutes before breakfast.  Prescribed #30 with 3 refills. 2.  Scheduled patient for an EGD with dilation and surveillance colonoscopy given history of adenomatous polyps with Dr. Hilarie Fredrickson in the [facility].  Did provide the patient a detailed list of risks for the procedure and she agrees to proceed. Patient is appropriate for endoscopic procedure(s) in the ambulatory ([facility]) setting.  Requested small volume prep.  She was given Suprep. 3.  Reviewed  anti-dysphagia measures. 4.  Patient to follow in clinic per recommendations from Dr. Hilarie Fredrickson after time of procedures  Ellouise Newer, PA-C [facility] 12/15/2020, 8:58 AM  Cc: Inda Coke, [place]

## 2020-12-15 NOTE — Patient Instructions (Signed)
We have sent the following medications to your pharmacy for you to pick up at your convenience: Pantoprazole 40 mg daily 30-60 minutes before breakfast.   You have been scheduled for an endoscopy and colonoscopy. Please follow the written instructions given to you at your visit today. Please pick up your prep supplies at the pharmacy within the next 1-3 days. If you use inhalers (even only as needed), please bring them with you on the day of your procedure.  If you are age 21 or older, your body mass index should be between 23-30. Your Body mass index is 36.85 kg/m. If this is out of the aforementioned range listed, please consider follow up with your Primary Care Provider.  If you are age 70 or younger, your body mass index should be between 19-25. Your Body mass index is 36.85 kg/m. If this is out of the aformentioned range listed, please consider follow up with your Primary Care Provider.   ________________________________________________________  The [facility] providers would like to encourage you to use [facility] to communicate with providers for non-urgent requests or questions.  Due to long hold times on the telephone, sending your provider a message by [facility] may be a faster and more efficient way to get a response.  Please allow 48 business hours for a response.  Please remember that this is for non-urgent requests.  _______________________________________________________

## 2021-01-12 ENCOUNTER — Ambulatory Visit (INDEPENDENT_AMBULATORY_CARE_PROVIDER_SITE_OTHER): Payer: No Typology Code available for payment source | Admitting: Physician Assistant

## 2021-01-12 ENCOUNTER — Encounter: Payer: Self-pay | Admitting: Physician Assistant

## 2021-01-12 ENCOUNTER — Other Ambulatory Visit: Payer: Self-pay

## 2021-01-12 VITALS — BP 120/82 | HR 79 | Temp 98.2°F | Ht 67.0 in | Wt 235.4 lb

## 2021-01-12 DIAGNOSIS — S50862A Insect bite (nonvenomous) of left forearm, initial encounter: Secondary | ICD-10-CM | POA: Diagnosis not present

## 2021-01-12 DIAGNOSIS — W57XXXA Bitten or stung by nonvenomous insect and other nonvenomous arthropods, initial encounter: Secondary | ICD-10-CM | POA: Diagnosis not present

## 2021-01-12 DIAGNOSIS — T7840XA Allergy, unspecified, initial encounter: Secondary | ICD-10-CM | POA: Diagnosis not present

## 2021-01-12 MED ORDER — TRIAMCINOLONE ACETONIDE 0.5 % EX OINT: TOPICAL_OINTMENT | CUTANEOUS | 1 refills | Status: DC

## 2021-01-12 MED ORDER — EPINEPHRINE 0.3 MG/0.3ML IJ SOAJ: 0.3000 mg | INTRAMUSCULAR | 1 refills | Status: DC | PRN

## 2021-01-12 NOTE — Progress Notes (Signed)
Megan Petty is a 60 y.o. female here for a insect bite.   History of Present Illness:   Chief Complaint  Patient presents with   Insect Bite    Pt c/o bug bites think it may be ants. Bite on left forearm was red and swollen she took 1 1/2 tablets of Benadryl at 8:00 AM , swelling and redness are better on left forearm.    HPI  Insect Bite Megan Petty presents with a swollen and red bite on left forearm that she believes to be an ant bite. According to Megan Petty, she believed it to be ant bites because her husband also had some and she has a known allergy. She has taken 1 1/2 tablets of 25mg  benadryl this morning at 8 AM, as well as 1/2 a tablet last night which improve swelling and redness. Upon waking up this morning, she did notice her face feeling as though she had a sunscreen. At this time she does not believe it to be related to the benadryl use due to no new sx.   Denies: swelling of throat, difficulty breathing, swelling of tongue, SOB  Past Medical History:  Diagnosis Date   Allergy    Asthma    seasonal   Cancer ([facility])    melanoma- left leg   Colon polyps    Diabetes mellitus without complication (HCC)    hx, not any longer   Family history of breast cancer    Family history of colon cancer    Family history of ovarian cancer    Family history of pancreatic cancer    GERD (gastroesophageal reflux disease)    H/O hiatal hernia    had surgery to fix with last surgery   Hyperlipidemia    Hypertension      Social History   Tobacco Use   Smoking status: Former    Packs/day: 0.13    Years: 10.00    Pack years: 1.30    Types: Cigarettes    Quit date: 02/14/2009    Years since quitting: 11.9   Smokeless tobacco: Never  Vaping Use   Vaping Use: Never used  Substance Use Topics   Alcohol use: No   Drug use: No    Past Surgical History:  Procedure Laterality Date   ABDOMINAL HYSTERECTOMY     ABDOMINOPLASTY/PANNICULECTOMY WITH LIPOSUCTION N/A 12/31/2012   Procedure:  PANNICULECTOMY WITH LIPOSUCTION;  Surgeon: Cristine Polio, MD;  Location: [facility];  Service: Plastics;  Laterality: N/A;   CESAREAN SECTION     CHOLECYSTECTOMY N/A 11/03/2017   Procedure: LAPAROSCOPIC CHOLECYSTECTOMY;  Surgeon: Clovis Riley, MD;  Location: [facility];  Service: General;  Laterality: N/A;   COLONOSCOPY     HERNIA REPAIR     LAPAROSCOPIC GASTRIC SLEEVE RESECTION     LITHOTRIPSY     MELANOMA EXCISION     TONSILLECTOMY      Family History  Problem Relation Age of Onset   Colon cancer Mother        gallbladder cancer spread to colon   Cancer Mother 54       gallbladder   Lung cancer Father 73   Pancreatic cancer Brother 67       Maternal 1/2 brother   Breast cancer Paternal Aunt 67   Kidney cancer Maternal Grandmother    Lung cancer Maternal Grandfather    Breast cancer Paternal Grandmother        dx in her 11s   Ovarian cancer Paternal Grandmother  dx in her 73s   Esophageal cancer Neg Hx    Rectal cancer Neg Hx    Stomach cancer Neg Hx     Allergies  Allergen Reactions   Vicodin [Hydrocodone-Acetaminophen] Itching    Current Medications:   Current Outpatient Medications:    albuterol (VENTOLIN HFA) 108 (90 Base) MCG/ACT inhaler, Inhale 2 puffs into the lungs every 6 (six) hours as needed for wheezing or shortness of breath., Disp: 18 g, Rfl: 3   betamethasone valerate (VALISONE) 0.1 % cream, as needed. , Disp: , Rfl:    EPINEPHrine 0.3 mg/0.3 mL IJ SOAJ injection, Inject 0.3 mLs (0.3 mg total) into the muscle as needed for anaphylaxis., Disp: 2 each, Rfl: 1   Lidocaine, Anorectal, 5 % CREA, lidocaine 5 % topical cream  Apply 1 application by topical route., Disp: , Rfl:    pantoprazole (PROTONIX) 40 MG tablet, Take 1 tablet (40 mg total) by mouth daily., Disp: 30 tablet, Rfl: 2   Review of Systems:   ROS Negative unless otherwise specified per HPI. Vitals:   Vitals:   01/12/21 1300  BP: 120/82  Pulse: 79  Temp: 98.2 F (36.8 C)  TempSrc:  Temporal  SpO2: 97%  Weight: 235 lb 6.1 oz (106.8 kg)  Height: 5\' 7"  (1.702 m)     Body mass index is 36.87 kg/m.  Physical Exam:   Physical Exam Constitutional:      Appearance: Normal appearance. She is well-developed.  HENT:     Head: Normocephalic and atraumatic.  Eyes:     General: Lids are normal.     Extraocular Movements: Extraocular movements intact.     Conjunctiva/sclera: Conjunctivae normal.  Pulmonary:     Effort: Pulmonary effort is normal.  Musculoskeletal:        General: Normal range of motion.     Cervical back: Normal range of motion and neck supple.  Skin:    General: Skin is warm and dry.     Comments: Distal erythema and swelling to lateral aspect of L arm  Neurological:     Mental Status: She is alert and oriented to person, place, and time.  Psychiatric:        Attention and Perception: Attention and perception normal.        Mood and Affect: Mood normal.        Behavior: Behavior normal.        Thought Content: Thought content normal.        Judgment: Judgment normal.    Assessment and Plan:   Allergic Reaction; Bug bite No red flags on exam; she is in NAD and clinically improving Trial an OTC antihistamine such as allegra, Claritin, or Xyzal daily as needed  Will prescribe topical triamcinolone cream for as needed use  Refill epi pen today Follow-up as needed, worsening precautions advised  I,Havlyn C Ratchford,acting as a scribe for [employer], PA.,have documented all relevant documentation on the behalf of Megan Coke, PA,as directed by  Megan Coke, PA while in the presence of Megan Petty, [place].  I, Megan Petty, [place], have reviewed all documentation for this visit. The documentation on 01/12/21 for the exam, diagnosis, procedures, and orders are all accurate and complete.  Megan Coke, PA-C

## 2021-01-13 ENCOUNTER — Telehealth: Payer: Self-pay

## 2021-01-13 NOTE — Telephone Encounter (Signed)
Called patient and she states she has seen the instructions for her colonoscopy. She received a hard copy.

## 2021-01-14 ENCOUNTER — Encounter: Payer: No Typology Code available for payment source | Admitting: Internal Medicine

## 2021-01-18 ENCOUNTER — Encounter: Payer: Self-pay | Admitting: Internal Medicine

## 2021-01-19 ENCOUNTER — Encounter: Payer: Self-pay | Admitting: Internal Medicine

## 2021-01-22 ENCOUNTER — Other Ambulatory Visit: Payer: Self-pay

## 2021-01-22 ENCOUNTER — Telehealth: Payer: Self-pay | Admitting: Internal Medicine

## 2021-01-22 ENCOUNTER — Ambulatory Visit (AMBULATORY_SURGERY_CENTER): Payer: No Typology Code available for payment source | Admitting: Internal Medicine

## 2021-01-22 ENCOUNTER — Encounter: Payer: Self-pay | Admitting: Internal Medicine

## 2021-01-22 ENCOUNTER — Encounter: Payer: No Typology Code available for payment source | Admitting: Internal Medicine

## 2021-01-22 VITALS — BP 124/81 | HR 61 | Temp 97.3°F | Resp 14 | Ht 67.0 in | Wt 235.0 lb

## 2021-01-22 DIAGNOSIS — Z86010 Personal history of colonic polyps: Secondary | ICD-10-CM

## 2021-01-22 DIAGNOSIS — D125 Benign neoplasm of sigmoid colon: Secondary | ICD-10-CM

## 2021-01-22 DIAGNOSIS — K317 Polyp of stomach and duodenum: Secondary | ICD-10-CM

## 2021-01-22 DIAGNOSIS — R1310 Dysphagia, unspecified: Secondary | ICD-10-CM

## 2021-01-22 DIAGNOSIS — Z800 Family history of malignant neoplasm of digestive organs: Secondary | ICD-10-CM | POA: Diagnosis not present

## 2021-01-22 DIAGNOSIS — K209 Esophagitis, unspecified without bleeding: Secondary | ICD-10-CM | POA: Diagnosis not present

## 2021-01-22 MED ORDER — PANTOPRAZOLE SODIUM 40 MG PO TBEC
40.0000 mg | DELAYED_RELEASE_TABLET | Freq: Every day | ORAL | 2 refills | Status: DC
Start: 2021-01-22 — End: 2022-11-18

## 2021-01-22 MED ORDER — SODIUM CHLORIDE 0.9 % IV SOLN: 500.0000 mL | Freq: Once | INTRAVENOUS | Status: DC

## 2021-01-22 NOTE — Patient Instructions (Addendum)
Handouts given on esophagitis and polyps. Await pathology results. Resume previous diet. Continue present medications - please begin pantoprazole 40 mg once a day. Pick up that prescription from [facility]. Office follow-up with me in 2-3 months. If dysphagia persists repeat EGD is recommended to check healing of esophagitis and to consider empiric dilation.  Repeat colonoscopy in 5 years for screening purposes.  YOU HAD AN ENDOSCOPIC PROCEDURE TODAY AT [facility]:   Refer to the procedure report that was given to you for any specific questions about what was found during the examination.  If the procedure report does not answer your questions, please call your gastroenterologist to clarify.  If you requested that your care partner not be given the details of your procedure findings, then the procedure report has been included in a sealed envelope for you to review at your convenience later.  YOU SHOULD EXPECT: Some feelings of bloating in the abdomen. Passage of more gas than usual.  Walking can help get rid of the air that was put into your GI tract during the procedure and reduce the bloating. If you had a lower endoscopy (such as a colonoscopy or flexible sigmoidoscopy) you may notice spotting of blood in your stool or on the toilet paper. If you underwent a bowel prep for your procedure, you may not have a normal bowel movement for a few days.  Please Note:  You might notice some irritation and congestion in your nose or some drainage.  This is from the oxygen used during your procedure.  There is no need for concern and it should clear up in a day or so.  SYMPTOMS TO REPORT IMMEDIATELY:  Following lower endoscopy (colonoscopy or flexible sigmoidoscopy):  Excessive amounts of blood in the stool  Significant tenderness or worsening of abdominal pains  Swelling of the abdomen that is new, acute  Fever of 100F or higher  Following upper endoscopy (EGD)  Vomiting of blood or  coffee ground material  New chest pain or pain under the shoulder blades  Painful or persistently difficult swallowing  New shortness of breath  Fever of 100F or higher  Black, tarry-looking stools  For urgent or emergent issues, a gastroenterologist can be reached at any hour by calling [phone]. Do not use MyChart messaging for urgent concerns.    DIET:  We do recommend a small meal at first, but then you may proceed to your regular diet.  Drink plenty of fluids but you should avoid alcoholic beverages for 24 hours.  ACTIVITY:  You should plan to take it easy for the rest of today and you should NOT DRIVE or use heavy machinery until tomorrow (because of the sedation medicines used during the test).    FOLLOW UP: Our staff will call the number listed on your records 48-72 hours following your procedure to check on you and address any questions or concerns that you may have regarding the information given to you following your procedure. If we do not reach you, we will leave a message.  We will attempt to reach you two times.  During this call, we will ask if you have developed any symptoms of COVID 19. If you develop any symptoms (ie: fever, flu-like symptoms, shortness of breath, cough etc.) before then, please call [phone].  If you test positive for Covid 19 in the 2 weeks post procedure, please call and report this information to [place].    If any biopsies were taken you will be  contacted by phone or by letter within the next 1-3 weeks.  Please call us at [phone] if you have not heard about the biopsies in 3 weeks.    SIGNATURES/CONFIDENTIALITY: You and/or your care partner have signed paperwork which will be entered into your electronic medical record.  These signatures attest to the fact that that the information above on your After Visit Summary has been reviewed and is understood.  Full responsibility of the confidentiality of this discharge information lies with you  and/or your care-partner.

## 2021-01-22 NOTE — Op Note (Signed)
[facility] Patient Name: Megan Petty Procedure Date: 01/22/2021 8:06 AM MRN: [id number] Endoscopist: Jerene Bears , MD Age: 60 Referring MD:  Date of Birth: 1960-07-07 Gender: Female Account #: [id number] Procedure:                Colonoscopy Indications:              High risk colon cancer surveillance: Personal                            history of multiple adenomas, Family history of                            colon cancer in a first-degree relative, Last                            colonoscopy: December 2019 Medicines:                Monitored Anesthesia Care Procedure:                Pre-Anesthesia Assessment:                           - Prior to the procedure, a History and Physical                            was performed, and patient medications and                            allergies were reviewed. The patient's tolerance of                            previous anesthesia was also reviewed. The risks                            and benefits of the procedure and the sedation                            options and risks were discussed with the patient.                            All questions were answered, and informed consent                            was obtained. Prior Anticoagulants: The patient has                            taken no previous anticoagulant or antiplatelet                            agents. ASA Grade Assessment: II - A patient with                            mild systemic disease. After reviewing the risks  and benefits, the patient was deemed in                            satisfactory condition to undergo the procedure.                           After obtaining informed consent, the colonoscope                            was passed under direct vision. Throughout the                            procedure, the patient's blood pressure, pulse, and                            oxygen saturations were monitored  continuously. The                            Olympus CF-[id number]) Colonoscope was                            introduced through the anus and advanced to the                            cecum, identified by appendiceal orifice and                            ileocecal valve. The colonoscopy was performed                            without difficulty. The patient tolerated the                            procedure well. The quality of the bowel                            preparation was good. The ileocecal valve,                            appendiceal orifice, and rectum were photographed. Scope In: 8:20:55 AM Scope Out: 8:34:46 AM Scope Withdrawal Time: 0 hours 10 minutes 36 seconds  Total Procedure Duration: 0 hours 13 minutes 51 seconds  Findings:                 The digital rectal exam was normal.                           A 4 mm polyp was found in the sigmoid colon. The                            polyp was sessile. The polyp was removed with a                            cold snare. Resection and retrieval were complete.  Multiple small-mouthed diverticula were found in                            the sigmoid colon and distal descending colon.                           Internal hemorrhoids were found during                            retroflexion. The hemorrhoids were medium-sized. Complications:            No immediate complications. Estimated Blood Loss:     Estimated blood loss was minimal. Impression:               - One 4 mm polyp in the sigmoid colon, removed with                            a cold snare. Resected and retrieved.                           - Diverticulosis in the sigmoid colon and in the                            distal descending colon.                           - Internal hemorrhoids. Recommendation:           - Patient has a contact number available for                            emergencies. The signs and symptoms of potential                             delayed complications were discussed with the                            patient. Return to normal activities tomorrow.                            Written discharge instructions were provided to the                            patient.                           - Resume previous diet.                           - Continue present medications.                           - Await pathology results.                           - Repeat colonoscopy in 5 years for surveillance. Jerene Bears, MD 01/22/2021 8:45:16 AM This report has been  signed electronically.

## 2021-01-22 NOTE — Op Note (Signed)
[facility] Patient Name: Megan Petty Procedure Date: 01/22/2021 8:06 AM MRN: [id number] Endoscopist: Jerene Bears , MD Age: 60 Referring MD:  Date of Birth: [birth year] Gender: Female Account #: [id number] Procedure:                Upper GI endoscopy Indications:              Dysphagia, Gastro-esophageal reflux disease Medicines:                Monitored Anesthesia Care Procedure:                Pre-Anesthesia Assessment:                           - Prior to the procedure, a History and Physical                            was performed, and patient medications and                            allergies were reviewed. The patient's tolerance of                            previous anesthesia was also reviewed. The risks                            and benefits of the procedure and the sedation                            options and risks were discussed with the patient.                            All questions were answered, and informed consent                            was obtained. Prior Anticoagulants: The patient has                            taken no previous anticoagulant or antiplatelet                            agents. ASA Grade Assessment: II - A patient with                            mild systemic disease. After reviewing the risks                            and benefits, the patient was deemed in                            satisfactory condition to undergo the procedure.                           After obtaining informed consent, the endoscope was  passed under direct vision. Throughout the                            procedure, the patient's blood pressure, pulse, and                            oxygen saturations were monitored continuously. The                            GIF [id number] #[id number] was introduced through the                            mouth, and advanced to the second part of duodenum.                            The upper GI  endoscopy was accomplished without                            difficulty. The patient tolerated the procedure                            well. Scope In: Scope Out: Findings:                 LA Grade B (one or more mucosal breaks greater than                            5 mm, not extending between the tops of two mucosal                            folds) esophagitis with no bleeding was found at                            the gastroesophageal junction. Biopsies were taken                            with a cold forceps for histology. No definitive                            stricture was seen.                           A 2 cm hiatal hernia was present.                           Evidence of a sleeve gastrectomy was found in the                            gastric body.                           A single 8 mm sessile polyp with no stigmata of  recent bleeding was found in the gastric antrum.                            Biopsies were taken with a cold forceps for                            histology.                           The examined duodenum was normal. Complications:            No immediate complications. Estimated Blood Loss:     Estimated blood loss was minimal. Impression:               - LA Grade B reflux esophagitis with no bleeding.                            Biopsied. This is the likely cause of dysphagia                            symptom.                           - 2 cm hiatal hernia.                           - A sleeve gastrectomy was found.                           - A single gastric polyp. Biopsied.                           - Normal examined duodenum. Recommendation:           - Patient has a contact number available for                            emergencies. The signs and symptoms of potential                            delayed complications were discussed with the                            patient. Return to normal activities tomorrow.                             Written discharge instructions were provided to the                            patient.                           - Resume previous diet.                           - Continue present medications. Please begin  pantoprazole 40 mg once daily.                           - Await pathology results.                           - Office folllow-up with me in 2-3 months. If                            dysphagia persists repeat EGD is recommended to                            check healing of esophagitis and to consider                            empiric dilation. Jerene Bears, MD 01/22/2021 8:41:56 AM This report has been signed electronically.

## 2021-01-22 NOTE — Progress Notes (Signed)
Pt's states no medical or surgical changes since previsit or office visit. 

## 2021-01-22 NOTE — Progress Notes (Signed)
Report given to PACU, vss 

## 2021-01-22 NOTE — Progress Notes (Signed)
Patient ID: Megan Petty, female   DOB: [birth year], 60 y.o.   MRN: [id number]    GASTROENTEROLOGY PROCEDURE H&P NOTE   Primary Care Physician: Inda Coke, PA    Reason for Procedure:  Dysphagia symptom, occasional GERD, history of adenomatous colon polyps  Plan:    EGD and colonoscopy  Patient is appropriate for endoscopic procedure(s) in the ambulatory ([place]) setting.  The nature of the procedure, as well as the risks, benefits, and alternatives were carefully and thoroughly reviewed with the patient. Ample time for discussion and questions allowed. The patient understood, was satisfied, and agreed to proceed.     HPI: Megan Petty is a 60 y.o. female who presents for EGD and colonoscopy.  Medical history as below.  See note dated 12/15/2020 for details.  No significant changes since that time.  No recent chest pain or shortness of breath.  Tolerated the prep.  No abdominal pain today.  Past Medical History:  Diagnosis Date   Allergy    Asthma    seasonal   Cancer ([place])    melanoma- left leg   Colon polyps    Diabetes mellitus without complication (HCC)    hx, not any longer   Family history of breast cancer    Family history of colon cancer    Family history of ovarian cancer    Family history of pancreatic cancer    GERD (gastroesophageal reflux disease)    H/O hiatal hernia    had surgery to fix with last surgery   Hyperlipidemia    Hypertension     Past Surgical History:  Procedure Laterality Date   ABDOMINAL HYSTERECTOMY     ABDOMINOPLASTY/PANNICULECTOMY WITH LIPOSUCTION N/A 12/31/2012   Procedure: PANNICULECTOMY WITH LIPOSUCTION;  Surgeon: Cristine Polio, MD;  Location: [place];  Service: Plastics;  Laterality: N/A;   CESAREAN SECTION     CHOLECYSTECTOMY N/A 11/03/2017   Procedure: LAPAROSCOPIC CHOLECYSTECTOMY;  Surgeon: Clovis Riley, MD;  Location: [facility];  Service: General;  Laterality: N/A;   COLONOSCOPY     HERNIA REPAIR     LAPAROSCOPIC  GASTRIC SLEEVE RESECTION     LITHOTRIPSY     MELANOMA EXCISION     TONSILLECTOMY      Prior to Admission medications   Medication Sig Start Date End Date Taking? Authorizing Provider  albuterol (VENTOLIN HFA) 108 (90 Base) MCG/ACT inhaler Inhale 2 puffs into the lungs every 6 (six) hours as needed for wheezing or shortness of breath. 11/17/20   Inda Coke, PA  betamethasone valerate (VALISONE) 0.1 % cream as needed.     [provider]  EPINEPHrine 0.3 mg/0.3 mL IJ SOAJ injection Inject 0.3 mg into the muscle as needed for anaphylaxis. 01/12/21   Inda Coke, PA  Lidocaine, Anorectal, 5 % CREA lidocaine 5 % topical cream  Apply 1 application by topical route.    [provider]  pantoprazole (PROTONIX) 40 MG tablet Take 1 tablet (40 mg total) by mouth daily. Patient not taking: Reported on 01/22/2021 12/15/20   Levin Erp, PA  triamcinolone ointment (KENALOG) 0.5 % Apply to affected area 1-2 times daily 01/12/21   Inda Coke, PA    Current Outpatient Medications  Medication Sig Dispense Refill   albuterol (VENTOLIN HFA) 108 (90 Base) MCG/ACT inhaler Inhale 2 puffs into the lungs every 6 (six) hours as needed for wheezing or shortness of breath. 18 g 3   betamethasone valerate (VALISONE) 0.1 % cream as needed.  EPINEPHrine 0.3 mg/0.3 mL IJ SOAJ injection Inject 0.3 mg into the muscle as needed for anaphylaxis. 2 each 1   Lidocaine, Anorectal, 5 % CREA lidocaine 5 % topical cream  Apply 1 application by topical route.     pantoprazole (PROTONIX) 40 MG tablet Take 1 tablet (40 mg total) by mouth daily. (Patient not taking: Reported on 01/22/2021) 30 tablet 2   triamcinolone ointment (KENALOG) 0.5 % Apply to affected area 1-2 times daily 15 g 1   Current Facility-Administered Medications  Medication Dose Route Frequency Provider Last Rate Last Admin   0.9 %  sodium chloride infusion  500 mL Intravenous Once Adelyna Brockman, Lajuan Lines, MD        Allergies  as of 01/22/2021 - Review Complete 01/22/2021  Allergen Reaction Noted   Vicodin [hydrocodone-acetaminophen] Itching 12/31/2012    Family History  Problem Relation Age of Onset   Colon cancer Mother        gallbladder cancer spread to colon   Cancer Mother 60       gallbladder   Lung cancer Father 5   Pancreatic cancer Brother 39       Maternal 1/2 brother   Breast cancer Paternal Aunt 8   Kidney cancer Maternal Grandmother    Lung cancer Maternal Grandfather    Breast cancer Paternal Grandmother        dx in her 30s   Ovarian cancer Paternal Grandmother        dx in her 30s   Esophageal cancer Neg Hx    Rectal cancer Neg Hx    Stomach cancer Neg Hx     Social History   Socioeconomic History   Marital status: Married    Spouse name: Not on file   Number of children: 2   Years of education: Not on file   Highest education level: Not on file  Occupational History   Not on file  Tobacco Use   Smoking status: Former    Packs/day: 0.13    Years: 10.00    Pack years: 1.30    Types: Cigarettes    Quit date: 02/14/2009    Years since quitting: 11.9   Smokeless tobacco: Never  Vaping Use   Vaping Use: Never used  Substance and Sexual Activity   Alcohol use: No   Drug use: No   Sexual activity: Not on file  Other Topics Concern   Not on file  Social History [employer] of [place]   30+ marriage; two children   55 yo granddaughter   Social Determinants of Health   Financial Resource Strain: Not on file  Food Insecurity: Not on file  Transportation Needs: Not on file  Physical Activity: Not on file  Stress: Not on file  Social Connections: Not on file  Intimate Partner Violence: Not on file    Physical Exam: Vital signs in last 24 hours: @BP  (!) 121/45   Pulse 73   Temp (!) 97.3 F (36.3 C) (Temporal)   Ht 5\' 7"  (1.702 m)   Wt 235 lb (106.6 kg)   SpO2 99%   BMI 36.81 kg/m  GEN: NAD EYE: Sclerae anicteric ENT: MMM CV:  Non-tachycardic Pulm: CTA b/l GI: Soft, NT/ND NEURO:  Alert & Oriented x 3   Zenovia Jarred, MD [facility]  01/22/2021 8:04 AM

## 2021-01-22 NOTE — Progress Notes (Signed)
Called to room to assist during endoscopic procedure.  Patient ID and intended procedure confirmed with present staff. Received instructions for my participation in the procedure from the performing physician.  

## 2021-01-22 NOTE — Telephone Encounter (Signed)
Patient called and states her [facility] has [facility] to [facility] and [facility] states that [facility] not [facility]. Please advise.

## 2021-01-22 NOTE — Progress Notes (Signed)
0805 Robinul 0.1 mg IV given due large amount of secretions upon assessment.  MD made aware, [facility]

## 2021-01-26 ENCOUNTER — Telehealth: Payer: Self-pay

## 2021-01-26 ENCOUNTER — Telehealth: Payer: Self-pay | Admitting: *Deleted

## 2021-01-26 NOTE — Telephone Encounter (Signed)
Left message on follow up call. 

## 2021-01-26 NOTE — Telephone Encounter (Signed)
Second follow up call attempt.  Message left previously this morning.

## 2021-01-27 ENCOUNTER — Encounter: Payer: Self-pay | Admitting: Internal Medicine

## 2021-02-11 ENCOUNTER — Encounter: Payer: Self-pay | Admitting: *Deleted

## 2021-04-08 ENCOUNTER — Telehealth: Payer: Self-pay | Admitting: Physician Assistant

## 2021-04-08 ENCOUNTER — Telehealth: Payer: Self-pay | Admitting: Internal Medicine

## 2021-04-08 NOTE — Telephone Encounter (Signed)
Patient called to let Dr. Hilarie Fredrickson know that the Protonix mediation as been helping her a lot.

## 2021-04-08 NOTE — Telephone Encounter (Signed)
Error

## 2021-04-13 ENCOUNTER — Ambulatory Visit: Payer: No Typology Code available for payment source | Admitting: Internal Medicine

## 2022-01-31 ENCOUNTER — Other Ambulatory Visit: Payer: Self-pay

## 2022-01-31 ENCOUNTER — Emergency Department (HOSPITAL_BASED_OUTPATIENT_CLINIC_OR_DEPARTMENT_OTHER)
Admission: EM | Admit: 2022-01-31 | Discharge: 2022-01-31 | Disposition: A | Discharge status: Home / Self Care | Payer: No Typology Code available for payment source | Attending: Emergency Medicine | Admitting: Emergency Medicine

## 2022-01-31 DIAGNOSIS — M5432 Sciatica, left side: Secondary | ICD-10-CM

## 2022-01-31 DIAGNOSIS — M5442 Lumbago with sciatica, left side: Secondary | ICD-10-CM | POA: Diagnosis not present

## 2022-01-31 DIAGNOSIS — M545 Low back pain, unspecified: Secondary | ICD-10-CM | POA: Diagnosis present

## 2022-01-31 MED ORDER — OXYCODONE-ACETAMINOPHEN 5-325 MG PO TABS
2.0000 | ORAL_TABLET | Freq: Once | ORAL | Status: AC
  Administered 2022-01-31: 2 via ORAL
  Filled 2022-01-31: qty 2

## 2022-01-31 MED ORDER — OXYCODONE-ACETAMINOPHEN 5-325 MG PO TABS: 1.0000 | ORAL_TABLET | Freq: Four times a day (QID) | ORAL | 0 refills | Status: DC | PRN

## 2022-01-31 MED ORDER — LIDOCAINE 5 % EX PTCH
2.0000 | MEDICATED_PATCH | CUTANEOUS | Status: DC
  Administered 2022-01-31: 2 via TRANSDERMAL
  Filled 2022-01-31: qty 2

## 2022-01-31 MED ORDER — LIDOCAINE 5 % EX PTCH: 1.0000 | MEDICATED_PATCH | CUTANEOUS | 0 refills | Status: DC

## 2022-01-31 NOTE — ED Triage Notes (Signed)
L low back pain radiating down L leg x 2 days. No known injury.

## 2022-01-31 NOTE — ED Notes (Signed)
Discharge instructions, follow up care, and prescriptions reviewed and explained, pt verbalized understanding. Pt caox4 and ambulatory on d/c.

## 2022-01-31 NOTE — ED Provider Notes (Signed)
[facility] EMERGENCY DEPT Provider Note   CSN: [id number] Arrival date & time: 01/31/22  3785     History  Chief Complaint  Patient presents with   Back Pain   HPI Megan Petty is a 61 y.o. female with history of kidney stone and gastric sleeve procedure presenting for back pain.  Back pain started Friday.  Located primarily in her left buttock and can radiate at times down to her toes.  The pain is dull and worse with lying down.  She has had a history of lower back pain before her gastric sleeve procedure.  At that time she was taking naproxen.  States she lost 200 pounds after gastric sleeve procedure and back pain improved overall.  Denies fever, saddle anesthesia, midline tenderness, bowel urinary incontinence.  Denies trauma.  No urinary changes.   Back Pain      Home Medications Prior to Admission medications   Medication Sig Start Date End Date Taking? Authorizing Provider  lidocaine (LIDODERM) 5 % Place 1 patch onto the skin daily. Remove & Discard patch within 12 hours or as directed by MD 01/31/22  Yes Harriet Pho, PA-C  oxyCODONE-acetaminophen (PERCOCET/ROXICET) 5-325 MG tablet Take 1 tablet by mouth every 6 (six) hours as needed for up to 10 doses for severe pain. 01/31/22  Yes Harriet Pho, PA-C  albuterol (VENTOLIN HFA) 108 (90 Base) MCG/ACT inhaler Inhale 2 puffs into the lungs every 6 (six) hours as needed for wheezing or shortness of breath. 11/17/20   Inda Coke, PA  betamethasone valerate (VALISONE) 0.1 % cream as needed.     [provider]  EPINEPHrine 0.3 mg/0.3 mL IJ SOAJ injection Inject 0.3 mg into the muscle as needed for anaphylaxis. 01/12/21   Inda Coke, PA  Lidocaine, Anorectal, 5 % CREA lidocaine 5 % topical cream  Apply 1 application by topical route.    [provider]  pantoprazole (PROTONIX) 40 MG tablet Take 1 tablet (40 mg total) by mouth daily. Patient not taking: Reported on 01/22/2021  12/15/20   Levin Erp, PA  pantoprazole (PROTONIX) 40 MG tablet Take 1 tablet (40 mg total) by mouth daily. 01/22/21   Pyrtle, Lajuan Lines, MD  triamcinolone ointment (KENALOG) 0.5 % Apply to affected area 1-2 times daily 01/12/21   Inda Coke, PA      Allergies    Vicodin [hydrocodone-acetaminophen]    Review of Systems   Review of Systems  Musculoskeletal:  Positive for back pain.    Physical Exam Updated Vital Signs BP (!) 139/94 (BP Location: Right Arm)   Pulse 69   Temp 97.9 F (36.6 C) (Oral)   Resp 18   Ht 5' 7.5" (1.715 m)   Wt 104.3 kg   SpO2 99%   BMI 35.49 kg/m  Physical Exam Constitutional:      Appearance: Normal appearance.  HENT:     Head: Normocephalic.     Nose: Nose normal.  Eyes:     Conjunctiva/sclera: Conjunctivae normal.  Pulmonary:     Effort: Pulmonary effort is normal.  Abdominal:     Tenderness: There is no right CVA tenderness or left CVA tenderness.  Musculoskeletal:     Cervical back: Normal.     Thoracic back: Normal.     Lumbar back: Tenderness present. No signs of trauma or bony tenderness. Normal range of motion.     Comments: TTP at the right buttock.  Pain elicited with flexion extension of the back.  Neurological:  Mental Status: She is alert.  Psychiatric:        Mood and Affect: Mood normal.     ED Results / Procedures / Treatments   Labs (all labs ordered are listed, but only abnormal results are displayed) Labs Reviewed - No data to display  EKG None  Radiology No results found.  Procedures Procedures    Medications Ordered in ED Medications  lidocaine (LIDODERM) 5 % 2 patch (has no administration in time range)  oxyCODONE-acetaminophen (PERCOCET/ROXICET) 5-325 MG per tablet 2 tablet (has no administration in time range)    ED Course/ Medical Decision Making/ A&P                           Medical Decision Making  61 year old female who is well-appearing, nontoxic and hemodynamically stable  presenting for back pain.  Physical exam notable for TTP at the right buttock, pain with flexion extension of the back.  Differential diagnosis for this complaint includes sciatica, cauda equina syndrome, nephrolithiasis, pyelonephritis.  Considered nephrolithiasis and pyelonephritis but unlikely given no fever, no CVA tenderness and no urinary changes.  Also considered cauda equina syndrome but unlikely given no red flag symptoms of her back pain.  Symptoms are consistent with sciatica.  Treated with Lidoderm patch and Percocet.  Sent Percocet to her pharmacy for acute pain relief in the next 3 days.  Follow-up with her PCP.  Discussed return precautions.         Final Clinical Impression(s) / ED Diagnoses Final diagnoses:  Sciatica of left side    Rx / DC Orders ED Discharge Orders          Ordered    lidocaine (LIDODERM) 5 %  Every 24 hours        01/31/22 1028    oxyCODONE-acetaminophen (PERCOCET/ROXICET) 5-325 MG tablet  Every 6 hours PRN        01/31/22 1028              Harriet Pho, PA-C 01/31/22 1033    Sherwood Gambler, MD 02/02/22 [phone]

## 2022-01-31 NOTE — Discharge Instructions (Signed)
Evaluation for your Lower back pain revealed that you likely have sciatica.  Recommend that you do conservative treatment at home, please refer to the document about sciatica provided in your discharge instructions.  Recommend you follow-up with your PCP.

## 2022-02-04 ENCOUNTER — Encounter: Payer: Self-pay | Admitting: Physician Assistant

## 2022-02-04 ENCOUNTER — Ambulatory Visit (INDEPENDENT_AMBULATORY_CARE_PROVIDER_SITE_OTHER): Payer: No Typology Code available for payment source | Admitting: Physician Assistant

## 2022-02-04 VITALS — BP 110/76 | HR 74 | Temp 98.6°F | Resp 16 | Ht 67.5 in | Wt 227.2 lb

## 2022-02-04 DIAGNOSIS — G5702 Lesion of sciatic nerve, left lower limb: Secondary | ICD-10-CM | POA: Diagnosis not present

## 2022-02-04 MED ORDER — PREDNISONE 20 MG PO TABS: 40.0000 mg | ORAL_TABLET | Freq: Every day | ORAL | 0 refills | Status: DC

## 2022-02-04 NOTE — Progress Notes (Signed)
Megan Petty is a 61 y.o. female here for a new problem.  History of Present Illness:   Chief Complaint  Patient presents with   Sciatica    Left side  Flared up Friday and kept worsening until she was seen on 12/18 at ER Given lidocaine patches and roxicodone     HPI  Sciatica Pain Patient reports she hasn't had back pain in years. She states that last week she randomly felt a twitch of pain at the bottom of her left buttocks. This is not in the same place as her previous back pain. She reports that it is getting increasingly worse with everyday. Patient explains that lying down and sitting are close to impossible. Her symptom is relieved when standing. She went to the ER on 12/18 and was prescribed lidocaine patch and 5-325 mg percocet/roxicet that helped. She rates her pain level today as 3/10. She manages her pain with aleve and a heating pad at home.  She denies any new symptoms, stool incontinence, urinary incontinence.   Past Medical History:  Diagnosis Date   Allergy    Asthma    seasonal   Cancer ([place])    melanoma- left leg   Colon polyps    Diabetes mellitus without complication (HCC)    hx, not any longer   Family history of breast cancer    Family history of colon cancer    Family history of ovarian cancer    Family history of pancreatic cancer    Gastric polyp    GERD (gastroesophageal reflux disease)    H/O hiatal hernia    had surgery to fix with last surgery   Hyperlipidemia    Hypertension    Tubular adenoma of colon      Social History   Tobacco Use   Smoking status: Former    Packs/day: 0.13    Years: 10.00    Total pack years: 1.30    Types: Cigarettes    Quit date: 02/14/2009    Years since quitting: 12.9   Smokeless tobacco: Never  Vaping Use   Vaping Use: Never used  Substance Use Topics   Alcohol use: No   Drug use: No    Past Surgical History:  Procedure Laterality Date   ABDOMINAL HYSTERECTOMY     ABDOMINOPLASTY/PANNICULECTOMY  WITH LIPOSUCTION N/A 12/31/2012   Procedure: PANNICULECTOMY WITH LIPOSUCTION;  Surgeon: Cristine Polio, MD;  Location: [place];  Service: Plastics;  Laterality: N/A;   CESAREAN SECTION     CHOLECYSTECTOMY N/A 11/03/2017   Procedure: LAPAROSCOPIC CHOLECYSTECTOMY;  Surgeon: Clovis Riley, MD;  Location: [facility];  Service: General;  Laterality: N/A;   COLONOSCOPY     HERNIA REPAIR     LAPAROSCOPIC GASTRIC SLEEVE RESECTION     LITHOTRIPSY     MELANOMA EXCISION     TONSILLECTOMY      Family History  Problem Relation Age of Onset   Colon cancer Mother        gallbladder cancer spread to colon   Cancer Mother 24       gallbladder   Lung cancer Father 76   Pancreatic cancer Brother 21       Maternal 1/2 brother   Breast cancer Paternal Aunt 25   Kidney cancer Maternal Grandmother    Lung cancer Maternal Grandfather    Breast cancer Paternal Grandmother        dx in her 10s   Ovarian cancer Paternal Grandmother  dx in her 76s   Esophageal cancer Neg Hx    Rectal cancer Neg Hx    Stomach cancer Neg Hx     Allergies  Allergen Reactions   Vicodin [Hydrocodone-Acetaminophen] Itching    Current Medications:   Current Outpatient Medications:    albuterol (VENTOLIN HFA) 108 (90 Base) MCG/ACT inhaler, Inhale 2 puffs into the lungs every 6 (six) hours as needed for wheezing or shortness of breath., Disp: 18 g, Rfl: 3   EPINEPHrine 0.3 mg/0.3 mL IJ SOAJ injection, Inject 0.3 mg into the muscle as needed for anaphylaxis., Disp: 2 each, Rfl: 1   lidocaine (LIDODERM) 5 %, Place 1 patch onto the skin daily. Remove & Discard patch within 12 hours or as directed by MD, Disp: 30 patch, Rfl: 0   Lidocaine, Anorectal, 5 % CREA, lidocaine 5 % topical cream  Apply 1 application by topical route., Disp: , Rfl:    oxyCODONE-acetaminophen (PERCOCET/ROXICET) 5-325 MG tablet, Take 1 tablet by mouth every 6 (six) hours as needed for up to 10 doses for severe pain., Disp: 10 tablet, Rfl: 0    pantoprazole (PROTONIX) 40 MG tablet, Take 1 tablet (40 mg total) by mouth daily., Disp: 30 tablet, Rfl: 2   pantoprazole (PROTONIX) 40 MG tablet, Take 1 tablet (40 mg total) by mouth daily., Disp: 90 tablet, Rfl: 2   predniSONE (DELTASONE) 20 MG tablet, Take 2 tablets (40 mg total) by mouth daily., Disp: 10 tablet, Rfl: 0   triamcinolone ointment (KENALOG) 0.5 %, Apply to affected area 1-2 times daily, Disp: 15 g, Rfl: 1   betamethasone valerate (VALISONE) 0.1 % cream, as needed. , Disp: , Rfl:    Review of Systems:   Review of Systems  Neurological:        (+) sciatica    Vitals:   Vitals:   02/04/22 0840  BP: 110/76  Pulse: 74  Resp: 16  Temp: 98.6 F (37 C)  TempSrc: Temporal  SpO2: 97%  Weight: 227 lb 3.2 oz (103.1 kg)  Height: 5' 7.5" (1.715 m)     Body mass index is 35.06 kg/m.  Physical Exam:   Physical Exam Constitutional:      Appearance: Normal appearance. She is well-developed.  HENT:     Head: Normocephalic and atraumatic.  Eyes:     General: Lids are normal.     Extraocular Movements: Extraocular movements intact.     Conjunctiva/sclera: Conjunctivae normal.  Pulmonary:     Effort: Pulmonary effort is normal.  Musculoskeletal:        General: Normal range of motion.     Cervical back: Normal range of motion and neck supple.     Comments: No decreased ROM 2/2 pain with flexion/extension, lateral side bends, or rotation. Reproducible tenderness with deep palpation to left SI area. No bony tenderness.    Skin:    General: Skin is warm and dry.  Neurological:     Mental Status: She is alert and oriented to person, place, and time.  Psychiatric:        Attention and Perception: Attention and perception normal.        Mood and Affect: Mood normal.        Behavior: Behavior normal.        Thought Content: Thought content normal.        Judgment: Judgment normal.     Assessment and Plan:   Piriformis syndrome of left side No red flags Start  prednisone 20 mg BID  x 5 days Discussed need to avoid NSAIDs due to surgical hx Start PT Worsening precautions advised   I,Verona Buck,acting as a scribe for [employer], PA.,have documented all relevant documentation on the behalf of Inda Coke, PA,as directed by  Inda Coke, PA while in the presence of Inda Coke, [place].  I, Inda Coke, [place], have reviewed all documentation for this visit. The documentation on 02/04/22 for the exam, diagnosis, procedures, and orders are all accurate and complete.  Inda Coke, PA-C

## 2022-02-04 NOTE — Patient Instructions (Signed)
It was great to see you!  Let's start physical therapy -- you can schedule your first appointment while checking out. Start prednisone for your symptoms.  Avoid all OTC anti-inflammatories due to your history of gastric sleeve.  Let's follow-up at your convenience for a physical and blood work!   Voltaren Gel is available over the counter.   You are to apply this gel to your injured body part twice daily (morning and evening).   A little goes a long way so you can use about a pea-sized amount for each area.  Spread this small amount over the area into a thin film and let it dry.  Be sure that you do not rub the gel into your skin for more than 10 or 15 seconds otherwise it can irritate you skin.   Once you apply the gel, please do not put any other lotion or clothing in contact with that area for 30 minutes to allow the gel to absorb into your skin.   Some people are sensitive to the medication and can develop a sunburn-like rash.  If you have only mild symptoms it is okay to continue to use the medication but if you have any breakdown of your skin you should discontinue its use and please let us know.       Take care,  Inda Coke PA-C

## 2022-08-01 LAB — HM MAMMOGRAPHY

## 2022-08-03 ENCOUNTER — Other Ambulatory Visit: Payer: Self-pay | Admitting: Obstetrics and Gynecology

## 2022-08-03 DIAGNOSIS — Z800 Family history of malignant neoplasm of digestive organs: Secondary | ICD-10-CM

## 2022-09-08 ENCOUNTER — Other Ambulatory Visit: Payer: No Typology Code available for payment source

## 2022-10-19 ENCOUNTER — Ambulatory Visit
Admission: RE | Admit: 2022-10-19 | Discharge: 2022-10-19 | Disposition: A | Discharge status: Home / Self Care | Payer: No Typology Code available for payment source | Source: Ambulatory Visit | Attending: Obstetrics and Gynecology | Admitting: Obstetrics and Gynecology

## 2022-10-19 DIAGNOSIS — Z800 Family history of malignant neoplasm of digestive organs: Secondary | ICD-10-CM

## 2022-10-19 MED ORDER — GADOPICLENOL 0.5 MMOL/ML IV SOLN
10.0000 mL | Freq: Once | INTRAVENOUS | Status: AC | PRN
  Administered 2022-10-19: 10 mL via INTRAVENOUS

## 2022-11-17 ENCOUNTER — Encounter: Payer: Self-pay | Admitting: Physician Assistant

## 2022-11-18 ENCOUNTER — Other Ambulatory Visit: Payer: Self-pay

## 2022-11-18 DIAGNOSIS — K209 Esophagitis, unspecified without bleeding: Secondary | ICD-10-CM

## 2022-11-18 MED ORDER — PANTOPRAZOLE SODIUM 40 MG PO TBEC: 40.0000 mg | DELAYED_RELEASE_TABLET | Freq: Every day | ORAL | 2 refills | Status: DC

## 2022-11-18 NOTE — Telephone Encounter (Signed)
Ok to send in new Rx for Protonix to take daily?

## 2023-01-19 ENCOUNTER — Ambulatory Visit (INDEPENDENT_AMBULATORY_CARE_PROVIDER_SITE_OTHER): Payer: No Typology Code available for payment source | Admitting: Physician Assistant

## 2023-01-19 ENCOUNTER — Encounter: Payer: Self-pay | Admitting: Physician Assistant

## 2023-01-19 VITALS — BP 122/80 | HR 86 | Temp 97.2°F | Ht 67.0 in | Wt 231.8 lb

## 2023-01-19 DIAGNOSIS — R059 Cough, unspecified: Secondary | ICD-10-CM

## 2023-01-19 DIAGNOSIS — R6884 Jaw pain: Secondary | ICD-10-CM | POA: Diagnosis not present

## 2023-01-19 LAB — POCT INFLUENZA A/B
Influenza A, POC: NEGATIVE
Influenza B, POC: NEGATIVE

## 2023-01-19 LAB — POC COVID19 BINAXNOW: SARS Coronavirus 2 Ag: NEGATIVE

## 2023-01-19 MED ORDER — AZITHROMYCIN 250 MG PO TABS: ORAL_TABLET | ORAL | 0 refills | Status: AC

## 2023-01-19 NOTE — Progress Notes (Signed)
Megan Petty is a 62 y.o. female here for a new problem.  History of Present Illness:   Chief Complaint  Patient presents with   Cough    Started Monday cough up green phlegm    HPI  Cough: Complains of cough with expectoration of green mucus and congestion since Monday.  Reports that her symptoms are mildly improving.  Endorses ear congestion, sore throat, and hoarseness.  Notes possible sick contact with daughter with PNA and grandson. States she used cough drops yesterday to relieve her symptoms.  Tests done in-office today: Covid - NEG and Flu - NEG.   Jaw Pain (Bilateral): Reports that she's been having severe jaw pain at night. States that she's pretty sure she doesn't grind her teeth or clench her jaw while sleeping.  Past Medical History:  Diagnosis Date   Allergy    Asthma    seasonal   Cancer (HCC)    melanoma- left leg   Colon polyps    Diabetes mellitus without complication (HCC)    hx, not any longer   Family history of breast cancer    Family history of colon cancer    Family history of ovarian cancer    Family history of pancreatic cancer    Gastric polyp    GERD (gastroesophageal reflux disease)    H/O hiatal hernia    had surgery to fix with last surgery   Hyperlipidemia    Hypertension    Tubular adenoma of colon     Social History   Tobacco Use   Smoking status: Former    Current packs/day: 0.00    Average packs/day: 0.1 packs/day for 10.0 years (1.3 ttl pk-yrs)    Types: Cigarettes    Start date: 02/15/1999    Quit date: 02/14/2009    Years since quitting: 13.9   Smokeless tobacco: Never  Vaping Use   Vaping status: Never Used  Substance Use Topics   Alcohol use: No   Drug use: No   Past Surgical History:  Procedure Laterality Date   ABDOMINAL HYSTERECTOMY     ABDOMINOPLASTY/PANNICULECTOMY WITH LIPOSUCTION N/A 12/31/2012   Procedure: PANNICULECTOMY WITH LIPOSUCTION;  Surgeon: Louisa Second, MD;  Location: [facility];  Service:  Plastics;  Laterality: N/A;   CESAREAN SECTION     CHOLECYSTECTOMY N/A 11/03/2017   Procedure: LAPAROSCOPIC CHOLECYSTECTOMY;  Surgeon: Berna Bue, MD;  Location: [facility];  Service: General;  Laterality: N/A;   COLONOSCOPY     HERNIA REPAIR     LAPAROSCOPIC GASTRIC SLEEVE RESECTION     LITHOTRIPSY     MELANOMA EXCISION     TONSILLECTOMY     Family History  Problem Relation Age of Onset   Colon cancer Mother        gallbladder cancer spread to colon   Cancer Mother 15       gallbladder   Lung cancer Father 33   Pancreatic cancer Brother 38       Maternal 1/2 brother   Breast cancer Paternal Aunt 25   Kidney cancer Maternal Grandmother    Lung cancer Maternal Grandfather    Breast cancer Paternal Grandmother        dx in her 4s   Ovarian cancer Paternal Grandmother        dx in her 64s   Esophageal cancer Neg Hx    Rectal cancer Neg Hx    Stomach cancer Neg Hx    Allergies  Allergen Reactions   Vicodin [Hydrocodone-Acetaminophen] Itching  Current Medications:   Current Outpatient Medications:    albuterol (VENTOLIN HFA) 108 (90 Base) MCG/ACT inhaler, Inhale 2 puffs into the lungs every 6 (six) hours as needed for wheezing or shortness of breath., Disp: 18 g, Rfl: 3   EPINEPHrine 0.3 mg/0.3 mL IJ SOAJ injection, Inject 0.3 mg into the muscle as needed for anaphylaxis., Disp: 2 each, Rfl: 1   Lidocaine, Anorectal, 5 % CREA, lidocaine 5 % topical cream  Apply 1 application by topical route., Disp: , Rfl:    pantoprazole (PROTONIX) 40 MG tablet, Take 1 tablet (40 mg total) by mouth daily., Disp: 90 tablet, Rfl: 2   predniSONE (DELTASONE) 20 MG tablet, Take 2 tablets (40 mg total) by mouth daily., Disp: 10 tablet, Rfl: 0   triamcinolone ointment (KENALOG) 0.5 %, Apply to affected area 1-2 times daily, Disp: 15 g, Rfl: 1  Review of Systems:   ROS See pertinent positives and negatives as per the HPI.  Vitals:   Vitals:   01/19/23 1139  BP: 122/80  Pulse: 86  Temp:  (!) 97.2 F (36.2 C)  TempSrc: Temporal  SpO2: 95%  Weight: 231 lb 12.8 oz (105.1 kg)  Height: 5\' 7"  (1.702 m)     Body mass index is 36.31 kg/m.  Physical Exam:   Physical Exam Vitals and nursing note reviewed.  Constitutional:      General: She is not in acute distress.    Appearance: She is well-developed. She is not ill-appearing or toxic-appearing.  HENT:     Head: Normocephalic and atraumatic.     Right Ear: Tympanic membrane, ear canal and external ear normal. Tympanic membrane is not erythematous, retracted or bulging.     Left Ear: Tympanic membrane, ear canal and external ear normal. Tympanic membrane is not erythematous, retracted or bulging.     Nose: Nose normal.     Right Sinus: No maxillary sinus tenderness or frontal sinus tenderness.     Left Sinus: No maxillary sinus tenderness or frontal sinus tenderness.     Mouth/Throat:     Pharynx: Uvula midline. Posterior oropharyngeal erythema present.  Eyes:     General: Lids are normal.     Conjunctiva/sclera: Conjunctivae normal.  Neck:     Trachea: Trachea normal.  Cardiovascular:     Rate and Rhythm: Normal rate and regular rhythm.     Pulses: Normal pulses.     Heart sounds: Normal heart sounds, S1 normal and S2 normal.  Pulmonary:     Effort: Pulmonary effort is normal.     Breath sounds: Normal breath sounds. No decreased breath sounds, wheezing, rhonchi or rales.  Lymphadenopathy:     Cervical: No cervical adenopathy.  Skin:    General: Skin is warm and dry.  Neurological:     Mental Status: She is alert.     GCS: GCS eye subscore is 4. GCS verbal subscore is 5. GCS motor subscore is 6.  Psychiatric:        Speech: Speech normal.        Behavior: Behavior normal. Behavior is cooperative.     Assessment and Plan:   Cough, unspecified type No red flags on discussion, patient is not in any obvious distress during our visit. Discussed progression of most viral illness, and recommended supportive  care at this point in time. I did however provide pocket rx for oral azithromycin should symptoms not improve as anticipated. Discussed over the counter supportive care options, with recommendations to push fluids and rest.  Reviewed return precautions including new/worsening fever, SOB, new/worsening cough or other concerns.  Recommended need to self-quarantine and practice social distancing until symptoms resolve. Discussed current recommendations for COVID testing. I recommend that patient follow-up if symptoms worsen or persist despite treatment x 7-10 days, sooner if needed.  Jaw pain Unclear etiology Denies cardiac symptom(s) Offered baclofen but she declined Symptom(s) associated with uncontrolled heart burn - discussed increasing protonix to 40 mg twice daily to see if this helps   Recommend follow-up in 1 month for Comprehensive Physical Exam (CPE) preventive care annual visit and jaw pain -- sooner if concerns    I,Emily Lagle,acting as a scribe for [employer], PA.,have documented all relevant documentation on the behalf of Jarold Motto, PA,as directed by  Jarold Motto, PA while in the presence of Jarold Motto, [place].  I, Jarold Motto, [place], have reviewed all documentation for this visit. The documentation on 01/19/23 for the exam, diagnosis, procedures, and orders are all accurate and complete.  Jarold Motto, PA-C

## 2023-03-13 ENCOUNTER — Encounter: Payer: No Typology Code available for payment source | Admitting: Physician Assistant

## 2023-06-09 ENCOUNTER — Encounter: Payer: Self-pay | Admitting: Physician Assistant

## 2023-06-09 ENCOUNTER — Ambulatory Visit (INDEPENDENT_AMBULATORY_CARE_PROVIDER_SITE_OTHER): Admitting: Physician Assistant

## 2023-06-09 VITALS — BP 120/84 | HR 71 | Temp 97.3°F | Ht 67.0 in | Wt 230.0 lb

## 2023-06-09 DIAGNOSIS — E669 Obesity, unspecified: Secondary | ICD-10-CM

## 2023-06-09 DIAGNOSIS — K219 Gastro-esophageal reflux disease without esophagitis: Secondary | ICD-10-CM

## 2023-06-09 DIAGNOSIS — Z903 Acquired absence of stomach [part of]: Secondary | ICD-10-CM

## 2023-06-09 DIAGNOSIS — Z0000 Encounter for general adult medical examination without abnormal findings: Secondary | ICD-10-CM | POA: Diagnosis not present

## 2023-06-09 DIAGNOSIS — J4520 Mild intermittent asthma, uncomplicated: Secondary | ICD-10-CM | POA: Diagnosis not present

## 2023-06-09 MED ORDER — EPINEPHRINE 0.3 MG/0.3ML IJ SOAJ: 0.3000 mg | INTRAMUSCULAR | 1 refills | Status: AC | PRN

## 2023-06-09 MED ORDER — PANTOPRAZOLE SODIUM 40 MG PO TBEC: 40.0000 mg | DELAYED_RELEASE_TABLET | Freq: Every day | ORAL | 3 refills | Status: DC

## 2023-06-09 MED ORDER — ALBUTEROL SULFATE HFA 108 (90 BASE) MCG/ACT IN AERS: 2.0000 | INHALATION_SPRAY | Freq: Four times a day (QID) | RESPIRATORY_TRACT | 3 refills | Status: AC | PRN

## 2023-06-09 NOTE — Progress Notes (Signed)
 Subjective:    Megan Petty is a 63 y.o. female and is here for a comprehensive physical exam.  HPI  Health Maintenance Due  Topic Date Due   Pneumococcal Vaccine 37-59 Years old (1 of 2 - PCV) Never done   MAMMOGRAM  Never done    Acute Concerns: None   Chronic Issues: Hx of Skin Cancer  Patient reports having a lesion biopsied recently.  Awaiting pathology.  Condition is managed by dermatology  GERD  Patient complains of heartburn that is usually managed by pantoprazole  40 mg once daily.  She would occasionally experience break through episodes where she would experience a chocking sensation/ heartburn waking her up.  States these episodes occurs when she eats late.  Reports having an EGD to further investigate symptoms but due to her gastric sleeve that she underwent years ago, she limited options to manage her reflux.  She denies compliance of any bariatric vitamins. No further GI symptoms.   Asthma/Allergies  Patient reports compliance and good tolerance of albuterol . Last used about 2 weeks ago to excessive pollen. Requesting refills today Also requesting an epi pen refill.  Health Maintenance: Immunizations -- N/A Colonoscopy -- last done on 01/22/21. 3 year recall.  Mammogram -- UpToDate per patient --requesting records PAP -- last done on 02/23/12 Bone Density -- N/A Diet -- tries to follow a healthy diet Exercise -- typically goes to the gym 3x a week   Sleep habits -- no complaints Mood -- stable   UTD with dentist? - yes UTD with eye doctor? - yes  Weight history: Wt Readings from Last 10 Encounters:  06/09/23 230 lb (104.3 kg)  01/19/23 231 lb 12.8 oz (105.1 kg)  02/04/22 227 lb 3.2 oz (103.1 kg)  01/31/22 230 lb (104.3 kg)  01/22/21 235 lb (106.6 kg)  01/12/21 235 lb 6.1 oz (106.8 kg)  12/15/20 235 lb 4 oz (106.7 kg)  11/17/20 229 lb (103.9 kg)  09/30/20 229 lb 8 oz (104.1 kg)  08/12/19 227 lb (103 kg)   Body mass index is 36.02 kg/m. No  LMP recorded. Patient has had a hysterectomy.  Alcohol use:  reports no history of alcohol use.  Tobacco use:  Tobacco Use: Medium Risk (06/09/2023)   Patient History    Smoking Tobacco Use: Former    Smokeless Tobacco Use: Never    Passive Exposure: Not on file   Eligible for lung cancer screening?  No     06/09/2023    2:06 PM  Depression screen PHQ 2/9  Decreased Interest 0  Down, Depressed, Hopeless 0  PHQ - 2 Score 0     Other providers/specialists: Patient Care Team: Alexander Iba, [place] as PCP - General (Physician Assistant) Merryl Abraham, MD as Consulting Physician (Obstetrics and Gynecology)    PMHx, SurgHx, SocialHx, Medications, and Allergies were reviewed in the Visit Navigator and updated as appropriate.   Past Medical History:  Diagnosis Date   Allergy    Asthma    seasonal   Cancer (HCC)    melanoma- left leg   Colon polyps    Diabetes mellitus without complication (HCC)    hx, not any longer   Family history of breast cancer    Family history of colon cancer    Family history of ovarian cancer    Family history of pancreatic cancer    Gastric polyp    GERD (gastroesophageal reflux disease)    H/O hiatal hernia    had surgery to fix  with last surgery   Hyperlipidemia    Hypertension    Tubular adenoma of colon      Past Surgical History:  Procedure Laterality Date   ABDOMINAL HYSTERECTOMY     ABDOMINOPLASTY/PANNICULECTOMY WITH LIPOSUCTION N/A 12/31/2012   Procedure: PANNICULECTOMY WITH LIPOSUCTION;  Surgeon: Phyllis Breeze, MD;  Location: [facility];  Service: Plastics;  Laterality: N/A;   CESAREAN SECTION     CHOLECYSTECTOMY N/A 11/03/2017   Procedure: LAPAROSCOPIC CHOLECYSTECTOMY;  Surgeon: Adalberto Acton, MD;  Location: [facility];  Service: General;  Laterality: N/A;   COLONOSCOPY     COSMETIC SURGERY  2014   Removed hanging skin from stomach area   HERNIA REPAIR     LAPAROSCOPIC GASTRIC SLEEVE RESECTION     LITHOTRIPSY     MELANOMA  EXCISION     TONSILLECTOMY     TUBAL LIGATION  1998     Family History  Problem Relation Age of Onset   Colon cancer Mother        gallbladder cancer spread to colon   Cancer Mother 67       gallbladder   Lung cancer Father 17   COPD Father    Obesity Father    Pancreatic cancer Brother 61       Maternal 1/2 brother   Breast cancer Paternal Aunt 15   Kidney cancer Maternal Grandmother    Lung cancer Maternal Grandfather    Breast cancer Paternal Grandmother        dx in her 28s   Ovarian cancer Paternal Grandmother        dx in her 71s   ADD / ADHD Son    Esophageal cancer Neg Hx    Rectal cancer Neg Hx    Stomach cancer Neg Hx     Social History   Tobacco Use   Smoking status: Former    Current packs/day: 0.00    Average packs/day: 0.1 packs/day for 10.0 years (1.3 ttl pk-yrs)    Types: Cigarettes    Start date: 02/15/1999    Quit date: 02/14/2009    Years since quitting: 14.3   Smokeless tobacco: Never  Vaping Use   Vaping status: Never Used  Substance Use Topics   Alcohol use: No   Drug use: No    Review of Systems:   Review of Systems  Constitutional:  Negative for chills, fever, malaise/fatigue and weight loss.  HENT:  Negative for hearing loss, sinus pain and sore throat.   Respiratory:  Negative for cough and hemoptysis.   Cardiovascular:  Negative for chest pain, palpitations, leg swelling and PND.  Gastrointestinal:  Positive for heartburn. Negative for abdominal pain, constipation, diarrhea, nausea and vomiting.  Genitourinary:  Negative for dysuria, frequency and urgency.  Musculoskeletal:  Negative for back pain, myalgias and neck pain.  Skin:  Negative for itching and rash.  Neurological:  Negative for dizziness, tingling, seizures and headaches.  Endo/Heme/Allergies:  Negative for polydipsia.  Psychiatric/Behavioral:  Negative for depression. The patient is not nervous/anxious.      Objective:   BP 120/84 (BP Location: Left Arm, Patient  Position: Sitting, Cuff Size: Large)   Pulse 71   Temp (!) 97.3 F (36.3 C) (Temporal)   Ht 5\' 7"  (1.702 m)   Wt 230 lb (104.3 kg)   SpO2 98%   BMI 36.02 kg/m  Body mass index is 36.02 kg/m.   General Appearance:    Alert, cooperative, no distress, appears stated age  Head:  Normocephalic, without obvious abnormality, atraumatic  Eyes:    PERRL, conjunctiva/corneas clear, EOM's intact, fundi    benign, both eyes  Ears:    Normal TM's and external ear canals, both ears  Nose:   Nares normal, septum midline, mucosa normal, no drainage    or sinus tenderness  Throat:   Lips, mucosa, and tongue normal; teeth and gums normal  Neck:   Supple, symmetrical, trachea midline, no adenopathy;    thyroid:  no enlargement/tenderness/nodules; no carotid   bruit or JVD  Back:     Symmetric, no curvature, ROM normal, no CVA tenderness  Lungs:     Clear to auscultation bilaterally, respirations unlabored  Chest Wall:    No tenderness or deformity   Heart:    Regular rate and rhythm, S1 and S2 normal, no murmur, rub or gallop  Breast Exam:  Deferred  Abdomen:     Soft, non-tender, bowel sounds active all four quadrants,    no masses, no organomegaly  Genitalia:  Deferred  Extremities:   Extremities normal, atraumatic, no cyanosis or edema  Pulses:   2+ and symmetric all extremities  Skin:   Skin color, texture, turgor normal, no rashes or lesions  Lymph nodes:   Cervical, supraclavicular, and axillary nodes normal  Neurologic:   CNII-XII intact, normal strength, sensation and reflexes    throughout    Assessment/Plan:   Routine physical examination Today patient counseled on age appropriate routine health concerns for screening and prevention, each reviewed and up to date or declined. Immunizations reviewed and up to date or declined. Labs ordered and reviewed. Risk factors for depression reviewed and negative. Hearing function and visual acuity are intact. ADLs screened and addressed as  needed. Functional ability and level of safety reviewed and appropriate. Education, counseling and referrals performed based on assessed risks today. Patient provided with a copy of personalized plan for preventive services.  Gastroesophageal reflux disease, unspecified whether esophagitis present Uncontrolled Did discuss taking Protonix  30 to 60 minutes prior to meals If this is insufficient consider adding a Pepcid in the evening If this is also insufficient, recommend that she sees her gastroenterologist to consider adding an endoscopy to her colonoscopy this winter  Mild intermittent asthma without complication Well-controlled Refill albuterol  Follow-up as needed  Obesity, unspecified class, unspecified obesity type, unspecified whether serious comorbidity present Continue efforts at healthy diet and lifestyle  History of laparoscopic partial gastrectomy I encouraged her to start taking her bariatric vitamins, however she does have difficulty taking pills on a regular basis Will update blood work and advise accordingly   Alexander Iba, PA-C [facility]   I,Safa M Kadhim,acting as a Neurosurgeon for [employer], PA.,have documented all relevant documentation on the behalf of Alexander Iba, PA,as directed by  Alexander Iba, PA while in the presence of Alexander Iba, [place].   I, Alexander Iba, [place], have reviewed all documentation for this visit. The documentation on 06/09/23 for the exam, diagnosis, procedures, and orders are all accurate and complete.

## 2023-06-09 NOTE — Patient Instructions (Signed)
 It was great to see you!  Try to take protonix  30-60 minutes before meals If breakthrough reflux, add night time Pepcid AC   Please go to the lab for blood work.   Our office will call you with your results unless you have chosen to receive results via MyChart.  If your blood work is normal we will follow-up each year for physicals and as scheduled for chronic medical problems.  If anything is abnormal we will treat accordingly and get you in for a follow-up.  Take care,  Ginny Loomer

## 2023-06-10 LAB — CBC WITH DIFFERENTIAL/PLATELET
Absolute Lymphocytes: 1877 {cells}/uL (ref 850–3900)
Absolute Monocytes: 737 {cells}/uL (ref 200–950)
Basophils Absolute: 53 {cells}/uL (ref 0–200)
Basophils Relative: 0.7 %
Eosinophils Absolute: 137 {cells}/uL (ref 15–500)
Eosinophils Relative: 1.8 %
HCT: 40.9 % (ref 35.0–45.0)
Hemoglobin: 13.8 g/dL (ref 11.7–15.5)
MCH: 29.3 pg (ref 27.0–33.0)
MCHC: 33.7 g/dL (ref 32.0–36.0)
MCV: 86.8 fL (ref 80.0–100.0)
MPV: 10.5 fL (ref 7.5–12.5)
Monocytes Relative: 9.7 %
Neutro Abs: 4796 {cells}/uL (ref 1500–7800)
Neutrophils Relative %: 63.1 %
Platelets: 220 10*3/uL (ref 140–400)
RBC: 4.71 10*6/uL (ref 3.80–5.10)
RDW: 12.3 % (ref 11.0–15.0)
Total Lymphocyte: 24.7 %
WBC: 7.6 10*3/uL (ref 3.8–10.8)

## 2023-06-10 LAB — COMPREHENSIVE METABOLIC PANEL WITH GFR
AG Ratio: 1.5 (calc) (ref 1.0–2.5)
ALT: 18 U/L (ref 6–29)
AST: 19 U/L (ref 10–35)
Albumin: 3.9 g/dL (ref 3.6–5.1)
Alkaline phosphatase (APISO): 85 U/L (ref 37–153)
BUN/Creatinine Ratio: 28 (calc) — ABNORMAL HIGH (ref 6–22)
BUN: 26 mg/dL — ABNORMAL HIGH (ref 7–25)
CO2: 26 mmol/L (ref 20–32)
Calcium: 9.1 mg/dL (ref 8.6–10.4)
Chloride: 106 mmol/L (ref 98–110)
Creat: 0.94 mg/dL (ref 0.50–1.05)
Globulin: 2.6 g/dL (ref 1.9–3.7)
Glucose, Bld: 89 mg/dL (ref 65–99)
Potassium: 4.9 mmol/L (ref 3.5–5.3)
Sodium: 141 mmol/L (ref 135–146)
Total Bilirubin: 0.3 mg/dL (ref 0.2–1.2)
Total Protein: 6.5 g/dL (ref 6.1–8.1)
eGFR: 69 mL/min/{1.73_m2} (ref >=60)

## 2023-06-10 LAB — LIPID PANEL
Cholesterol: 174 mg/dL (ref <200)
HDL: 77 mg/dL (ref >=50)
LDL Cholesterol (Calc): 80 mg/dL
Non-HDL Cholesterol (Calc): 97 mg/dL (ref <130)
Total CHOL/HDL Ratio: 2.3 (calc) (ref <5.0)
Triglycerides: 90 mg/dL (ref <150)

## 2023-06-10 LAB — IRON,TIBC AND FERRITIN PANEL
%SAT: 26 % (ref 16–45)
Ferritin: 25 ng/mL (ref 16–288)
Iron: 93 ug/dL (ref 45–160)
TIBC: 352 ug/dL (ref 250–450)

## 2023-06-10 LAB — VITAMIN D 25 HYDROXY (VIT D DEFICIENCY, FRACTURES): Vit D, 25-Hydroxy: 26 ng/mL — ABNORMAL LOW (ref 30–100)

## 2023-06-10 LAB — VITAMIN B12: Vitamin B-12: 428 pg/mL (ref 200–1100)

## 2023-06-12 ENCOUNTER — Encounter: Payer: Self-pay | Admitting: Physician Assistant

## 2023-07-06 ENCOUNTER — Encounter: Payer: Self-pay | Admitting: Physician Assistant

## 2023-07-06 ENCOUNTER — Ambulatory Visit (INDEPENDENT_AMBULATORY_CARE_PROVIDER_SITE_OTHER): Admitting: Physician Assistant

## 2023-07-06 ENCOUNTER — Ambulatory Visit (HOSPITAL_COMMUNITY)
Admission: RE | Admit: 2023-07-06 | Discharge: 2023-07-06 | Disposition: A | Discharge status: Home / Self Care | Source: Ambulatory Visit | Attending: Physician Assistant | Admitting: Physician Assistant

## 2023-07-06 VITALS — BP 130/90 | HR 71 | Temp 97.3°F | Ht 67.0 in | Wt 229.2 lb

## 2023-07-06 DIAGNOSIS — R42 Dizziness and giddiness: Secondary | ICD-10-CM | POA: Diagnosis present

## 2023-07-06 LAB — COMPREHENSIVE METABOLIC PANEL WITH GFR
ALT: 14 U/L (ref 0–35)
AST: 14 U/L (ref 0–37)
Albumin: 4.2 g/dL (ref 3.5–5.2)
Alkaline Phosphatase: 75 U/L (ref 39–117)
BUN: 29 mg/dL — ABNORMAL HIGH (ref 6–23)
CO2: 29 meq/L (ref 19–32)
Calcium: 9.3 mg/dL (ref 8.4–10.5)
Chloride: 105 meq/L (ref 96–112)
Creatinine, Ser: 0.95 mg/dL (ref 0.40–1.20)
GFR: 64.7 mL/min (ref >60.00)
Glucose, Bld: 91 mg/dL (ref 70–99)
Potassium: 4.3 meq/L (ref 3.5–5.1)
Sodium: 140 meq/L (ref 135–145)
Total Bilirubin: 0.4 mg/dL (ref 0.2–1.2)
Total Protein: 7.2 g/dL (ref 6.0–8.3)

## 2023-07-06 LAB — CBC WITH DIFFERENTIAL/PLATELET
Basophils Absolute: 0 10*3/uL (ref 0.0–0.1)
Basophils Relative: 0.6 % (ref 0.0–3.0)
Eosinophils Absolute: 0.1 10*3/uL (ref 0.0–0.7)
Eosinophils Relative: 1.4 % (ref 0.0–5.0)
HCT: 43 % (ref 36.0–46.0)
Hemoglobin: 14.1 g/dL (ref 12.0–15.0)
Lymphocytes Relative: 23.8 % (ref 12.0–46.0)
Lymphs Abs: 1.4 10*3/uL (ref 0.7–4.0)
MCHC: 32.7 g/dL (ref 30.0–36.0)
MCV: 87.6 fl (ref 78.0–100.0)
Monocytes Absolute: 0.6 10*3/uL (ref 0.1–1.0)
Monocytes Relative: 10.6 % (ref 3.0–12.0)
Neutro Abs: 3.9 10*3/uL (ref 1.4–7.7)
Neutrophils Relative %: 63.6 % (ref 43.0–77.0)
Platelets: 229 10*3/uL (ref 150.0–400.0)
RBC: 4.91 Mil/uL (ref 3.87–5.11)
RDW: 13.5 % (ref 11.5–15.5)
WBC: 6.1 10*3/uL (ref 4.0–10.5)

## 2023-07-06 LAB — TSH: TSH: 0.92 u[IU]/mL (ref 0.35–5.50)

## 2023-07-06 MED ORDER — GADOBUTROL 1 MMOL/ML IV SOLN
10.0000 mL | Freq: Once | INTRAVENOUS | Status: AC | PRN
  Administered 2023-07-06: 10 mL via INTRAVENOUS

## 2023-07-06 NOTE — Progress Notes (Signed)
 Megan Petty is a 63 y.o. female here for a new problem.  History of Present Illness:   Chief Complaint  Patient presents with   Dizziness    Pt c/o dizziness thinks her Vertigo is back. Started on Sunday when she laid down,but now all the time.   Dizziness:  Pt complains of episodes of dizziness, starting about 4 days ago.  These episodes tend to last only a few seconds.  Her first episode occurred while lying down a few days ago.  She notes these episodes can also occur with positional changes, while sitting, or closing her eyes.  Describes the sensation during these episodes as a "head swoosh" and "movement".  Denies any spinning sensation.  She has tried the vertigo maneuvers with no improvements.  Had these episodes in the past several years ago but has not had frequent episodes since.  No recent head trauma.  Denies any headaches, vision changes, chest pain, shortness of breath, confusion, slurred speech, upper and lower extremity numbness/tingling, sinus congestion, sinus pressure, ear pressure, post-nasal drip, or allergies.    Past Medical History:  Diagnosis Date   Allergy    Asthma    seasonal   Cancer (HCC)    melanoma- left leg   Colon polyps    Diabetes mellitus without complication (HCC)    hx, not any longer   Family history of breast cancer    Family history of colon cancer    Family history of ovarian cancer    Family history of pancreatic cancer    Gastric polyp    GERD (gastroesophageal reflux disease)    H/O hiatal hernia    had surgery to fix with last surgery   Hyperlipidemia    Hypertension    Tubular adenoma of colon      Social History   Tobacco Use   Smoking status: Former    Current packs/day: 0.00    Average packs/day: 0.1 packs/day for 10.0 years (1.3 ttl pk-yrs)    Types: Cigarettes    Start date: 02/15/1999    Quit date: 02/14/2009    Years since quitting: 14.3   Smokeless tobacco: Never  Vaping Use   Vaping status: Never Used   Substance Use Topics   Alcohol use: No   Drug use: No    Past Surgical History:  Procedure Laterality Date   ABDOMINAL HYSTERECTOMY     ABDOMINOPLASTY/PANNICULECTOMY WITH LIPOSUCTION N/A 12/31/2012   Procedure: PANNICULECTOMY WITH LIPOSUCTION;  Surgeon: Phyllis Breeze, MD;  Location: [facility];  Service: Plastics;  Laterality: N/A;   CESAREAN SECTION     CHOLECYSTECTOMY N/A 11/03/2017   Procedure: LAPAROSCOPIC CHOLECYSTECTOMY;  Surgeon: Adalberto Acton, MD;  Location: [facility];  Service: General;  Laterality: N/A;   COLONOSCOPY     COSMETIC SURGERY  2014   Removed hanging skin from stomach area   HERNIA REPAIR     LAPAROSCOPIC GASTRIC SLEEVE RESECTION     LITHOTRIPSY     MELANOMA EXCISION     TONSILLECTOMY     TUBAL LIGATION  1998    Family History  Problem Relation Age of Onset   Colon cancer Mother        gallbladder cancer spread to colon   Cancer Mother 56       gallbladder   Lung cancer Father 25   COPD Father    Obesity Father    Pancreatic cancer Brother 69       Maternal 1/2 brother   Breast cancer  Paternal Aunt 49   Kidney cancer Maternal Grandmother    Lung cancer Maternal Grandfather    Breast cancer Paternal Grandmother        dx in her 21s   Ovarian cancer Paternal Grandmother        dx in her 104s   ADD / ADHD Son    Esophageal cancer Neg Hx    Rectal cancer Neg Hx    Stomach cancer Neg Hx     Allergies  Allergen Reactions   Vicodin [Hydrocodone -Acetaminophen ] Itching    Current Medications:   Current Outpatient Medications:    albuterol  (VENTOLIN  HFA) 108 (90 Base) MCG/ACT inhaler, Inhale 2 puffs into the lungs every 6 (six) hours as needed for wheezing or shortness of breath., Disp: 18 g, Rfl: 3   EPINEPHrine  0.3 mg/0.3 mL IJ SOAJ injection, Inject 0.3 mg into the muscle as needed for anaphylaxis., Disp: 2 each, Rfl: 1   lidocaine  (LIDODERM ) 5 %, Apply 1 patch topically daily., Disp: , Rfl:    Lidocaine , Anorectal, 5 % CREA, lidocaine  5 %  topical cream  Apply 1 application by topical route., Disp: , Rfl:    pantoprazole  (PROTONIX ) 40 MG tablet, Take 1 tablet (40 mg total) by mouth daily., Disp: 90 tablet, Rfl: 3   Review of Systems:   Negative unless otherwise specified per HPI.  Vitals:   Vitals:   07/06/23 1119 07/06/23 1125  BP: (!) 138/90 (!) 130/90  Pulse: 71   Temp: (!) 97.3 F (36.3 C)   TempSrc: Temporal   SpO2: 98%   Weight: 229 lb 4 oz (104 kg)   Height: 5\' 7"  (1.702 m)      Body mass index is 35.91 kg/m.  Physical Exam:   Physical Exam Vitals and nursing note reviewed.  Constitutional:      General: She is not in acute distress.    Appearance: She is well-developed. She is not ill-appearing or toxic-appearing.  Cardiovascular:     Rate and Rhythm: Normal rate and regular rhythm.     Pulses: Normal pulses.     Heart sounds: Normal heart sounds, S1 normal and S2 normal.  Pulmonary:     Effort: Pulmonary effort is normal.     Breath sounds: Normal breath sounds.  Skin:    General: Skin is warm and dry.  Neurological:     General: No focal deficit present.     Mental Status: She is alert.     GCS: GCS eye subscore is 4. GCS verbal subscore is 5. GCS motor subscore is 6.     Cranial Nerves: Cranial nerves 2-12 are intact.     Sensory: Sensation is intact.     Motor: Motor function is intact.     Coordination: Coordination is intact.     Gait: Gait is intact.  Psychiatric:        Speech: Speech normal.        Behavior: Behavior normal. Behavior is cooperative.     Assessment and Plan:   1. Dizziness of unknown cause (Primary) - CBC with Differential/Platelet - Comprehensive metabolic panel with GFR - TSH - MR Brain W Wo Contrast; Future   Unclear etiology -- this is not presentation of typical vertigo or her past symptom(s) either Due to constant nature of symptom(s), will order STAT MRI for further evaluation and management  Update blood work to rule out organic cause of  symptom(s) If new/worsening symptom(s) in the meantime, she was instructed to go to the  ER   I, Bernita Bristle, acting as a Neurosurgeon for Alexander Iba, [place]., have documented all relevant documentation on the behalf of Alexander Iba, [place], as directed by   while in the presence of Alexander Iba, [place].  I, Alexander Iba, [place], have reviewed all documentation for this visit. The documentation on 07/06/23 for the exam, diagnosis, procedures, and orders are all accurate and complete.  Alexander Iba, PA-C

## 2023-07-07 ENCOUNTER — Ambulatory Visit: Payer: Self-pay | Admitting: Physician Assistant

## 2023-07-07 ENCOUNTER — Other Ambulatory Visit: Payer: Self-pay | Admitting: Physician Assistant

## 2023-07-07 MED ORDER — MECLIZINE HCL 12.5 MG PO TABS: 12.5000 mg | ORAL_TABLET | Freq: Three times a day (TID) | ORAL | 0 refills | Status: AC | PRN

## 2023-07-07 MED ORDER — ATORVASTATIN CALCIUM 20 MG PO TABS: 20.0000 mg | ORAL_TABLET | Freq: Every day | ORAL | 3 refills | Status: DC

## 2023-08-13 ENCOUNTER — Encounter: Payer: Self-pay | Admitting: Physician Assistant

## 2023-10-24 ENCOUNTER — Other Ambulatory Visit: Payer: Self-pay | Admitting: Obstetrics and Gynecology

## 2023-10-24 DIAGNOSIS — Z800 Family history of malignant neoplasm of digestive organs: Secondary | ICD-10-CM

## 2023-11-20 ENCOUNTER — Ambulatory Visit
Admission: RE | Admit: 2023-11-20 | Discharge: 2023-11-20 | Disposition: A | Discharge status: Home / Self Care | Source: Ambulatory Visit | Attending: Obstetrics and Gynecology | Admitting: Obstetrics and Gynecology

## 2023-11-20 DIAGNOSIS — Z800 Family history of malignant neoplasm of digestive organs: Secondary | ICD-10-CM

## 2023-11-20 MED ORDER — GADOPICLENOL 0.5 MMOL/ML IV SOLN
10.0000 mL | Freq: Once | INTRAVENOUS | Status: AC | PRN
  Administered 2023-11-20: 10 mL via INTRAVENOUS

## 2024-02-19 ENCOUNTER — Encounter: Payer: Self-pay | Admitting: Physician Assistant

## 2024-02-19 ENCOUNTER — Ambulatory Visit (INDEPENDENT_AMBULATORY_CARE_PROVIDER_SITE_OTHER): Admitting: Physician Assistant

## 2024-02-19 VITALS — BP 130/86 | HR 94 | Ht 67.0 in | Wt 245.0 lb

## 2024-02-19 DIAGNOSIS — K219 Gastro-esophageal reflux disease without esophagitis: Secondary | ICD-10-CM

## 2024-02-19 DIAGNOSIS — K449 Diaphragmatic hernia without obstruction or gangrene: Secondary | ICD-10-CM | POA: Diagnosis not present

## 2024-02-19 DIAGNOSIS — K2100 Gastro-esophageal reflux disease with esophagitis, without bleeding: Secondary | ICD-10-CM

## 2024-02-19 MED ORDER — PANTOPRAZOLE SODIUM 40 MG PO TBEC: 40.0000 mg | DELAYED_RELEASE_TABLET | Freq: Two times a day (BID) | ORAL | 3 refills | Status: AC

## 2024-02-19 NOTE — Progress Notes (Signed)
 "     Megan Console, PA-C [address] [place], [place]  [address] Phone: [phone]   Primary Care Physician: Job Lukes, [place]  Primary Gastroenterologist:  Megan Console, PA-C / Dr. Gordy Starch   Chief Complaint: Acid reflux       HPI:   Discussed the use of AI scribe software for clinical note transcription with the patient, who gave verbal consent to proceed. History of Present Illness Megan Petty is a 64 year old female with GERD, esophagitis, hiatal hernia, and prior gastric sleeve surgery who presents for evaluation of worsening reflux symptoms.  Gastroesophageal reflux symptoms and esophagitis - Longstanding history of acid reflux and hiatal hernia. - Worsening reflux symptoms since June 2025, with nocturnal episodes of choking, inability to breathe, and severe retrosternal burning described as 'chest felt like it was on fire.' - Frequency of nocturnal episodes increased to almost weekly since November 2025. - Most recent severe episode occurred Saturday night, with awakening unable to speak, intense chest burning, and expectoration of 'the brightest yellow' material after airway clearance. - Daytime symptoms are minimal; most issues arise in the evening or at night. - Sleeps with two pillows to elevate head, without symptom improvement. - Significant anxiety regarding severity of symptoms, particularly after recent nocturnal episode, and concern for aspiration and esophageal injury. - Experiences reflux episodes while traveling and staying in hotels. - Last upper endoscopy in 2022 showed grade B esophagitis.  Prior gastric sleeve surgery - History of gastric sleeve surgery performed in the past. - Previously trialed omeprazole after gastric sleeve surgery, which was effective during a period.  Acid suppression therapy - Pantoprazole  40 mg daily taken in the morning on an empty stomach for management of reflux and esophagitis. - No recent use of other proton  pump inhibitors or H2 blockers. - Chews Tums for immediate relief, especially in the mid-afternoon, evening, and before bedtime, anticipating nocturnal symptoms.  Dietary and lifestyle factors - Dietary triggers include occasional carbonated beverages [facility], Coke Zero), morning coffee with a protein shake, and use of garlic and onion spices. - Avoids citrus, juices, and chocolate, and generally follows a low-carb diet. - Actively working to reduce intake of carbonated drinks, garlic, and onions.   01/2021 last colonoscopy by Dr. Starch: 1 small 4 mm tubular adenoma polyp removed from sigmoid colon.  Good prep.  Diverticulosis.  Medium internal hemorrhoids.  5-year repeat (due 01/2026).  01/2021 last EGD by Dr. Starch: LA grade B esophagitis at GE junction.  No stricture.  2 cm hiatal hernia.  Evidence of sleeve gastrectomy.  1 small 8 mm hyperplastic gastric polyp in the gastric antrum.  Normal duodenum.  Biopsies negative for Barrett's and EOE.  She has family history of pancreatic cancer.  Abdominal MRI/MRCP with and without contrast 11/20/2023 ordered by GYN Dr. Leva for pancreas cancer screening:  1.  No pancreas lesions.  No acute abnormality.  Mild pancreas atrophy with mild fatty replacement. 2.  Moderate hiatal hernia. 3.  Normal liver. 4.  Prior cholecystectomy.   5.  Bilateral kidney cysts.   Current Outpatient Medications  Medication Sig Dispense Refill   albuterol  (VENTOLIN  HFA) 108 (90 Base) MCG/ACT inhaler Inhale 2 puffs into the lungs every 6 (six) hours as needed for wheezing or shortness of breath. 18 g 3   atorvastatin  (LIPITOR) 20 MG tablet Take 20 mg by mouth daily.     calcium  carbonate (TUMS - DOSED IN MG ELEMENTAL CALCIUM ) 500 MG chewable tablet  Chew 1 tablet by mouth at bedtime.     EPINEPHrine  0.3 mg/0.3 mL IJ SOAJ injection Inject 0.3 mg into the muscle as needed for anaphylaxis. 2 each 1   IRON -VITAMIN C PO Take 1 tablet by mouth every other day.      lidocaine  (LIDODERM ) 5 % Apply 1 patch topically daily.     Lidocaine , Anorectal, 5 % CREA lidocaine  5 % topical cream  Apply 1 application by topical route.     MAGNESIUM SULFATE PO Take 1 tablet by mouth every other day.     meclizine  (ANTIVERT ) 12.5 MG tablet Take 1 tablet (12.5 mg total) by mouth 3 (three) times daily as needed for dizziness. 30 tablet 0   pantoprazole  (PROTONIX ) 40 MG tablet Take 1 tablet (40 mg total) by mouth daily. 90 tablet 3   No current facility-administered medications for this visit.    Allergies as of 02/19/2024 - Review Complete 02/19/2024  Allergen Reaction Noted   Vicodin [hydrocodone -acetaminophen ] Itching 12/31/2012    Past Medical History:  Diagnosis Date   Allergy    Asthma    seasonal   Cancer (HCC)    melanoma- left leg   Colon polyps    Diabetes mellitus without complication (HCC)    hx, not any longer   Family history of breast cancer    Family history of colon cancer    Family history of ovarian cancer    Family history of pancreatic cancer    Gastric polyp    GERD (gastroesophageal reflux disease)    H/O hiatal hernia    had surgery to fix with last surgery   Hyperlipidemia    Hypertension    Tubular adenoma of colon     Past Surgical History:  Procedure Laterality Date   ABDOMINAL HYSTERECTOMY     ABDOMINOPLASTY/PANNICULECTOMY WITH LIPOSUCTION N/A 12/31/2012   Procedure: PANNICULECTOMY WITH LIPOSUCTION;  Surgeon: Elna Pick, MD;  Location: [facility] OR;  Service: Plastics;  Laterality: N/A;   CESAREAN SECTION     CHOLECYSTECTOMY N/A 11/03/2017   Procedure: LAPAROSCOPIC CHOLECYSTECTOMY;  Surgeon: Signe Mitzie LABOR, MD;  Location: [facility];  Service: General;  Laterality: N/A;   COLONOSCOPY     COSMETIC SURGERY  2014   Removed hanging skin from stomach area   HERNIA REPAIR     LAPAROSCOPIC GASTRIC SLEEVE RESECTION     LITHOTRIPSY     MELANOMA EXCISION     TONSILLECTOMY     TUBAL LIGATION  1998    Review of Systems:     All systems reviewed and negative except where noted in HPI.    Physical Exam:  BP 130/86   Pulse 94   Ht 5' 7 (1.702 m)   Wt 245 lb (111.1 kg)   SpO2 97%   BMI 38.37 kg/m  No LMP recorded. Patient has had a hysterectomy.  General: Well-nourished, well-developed in no acute distress.  Lungs: Clear to auscultation bilaterally. Non-labored. Heart: Regular rate and rhythm, no murmurs rubs or gallops.  Abdomen: Bowel sounds are normal; Abdomen is Soft and obese; No hepatosplenomegaly, masses or hernias;  No Abdominal Tenderness; No guarding or rebound tenderness. Neuro: Alert and oriented x 3.  Grossly intact.  Psych: Alert and cooperative, normal mood and affect.   Imaging Studies: No results found.  Labs: CBC    Component Value Date/Time   WBC 6.1 07/06/2023 1147   RBC 4.91 07/06/2023 1147   HGB 14.1 07/06/2023 1147   HGB 13.8 07/06/2018 0922   HCT  43.0 07/06/2023 1147   PLT 229.0 07/06/2023 1147   PLT 203 07/06/2018 0922   MCV 87.6 07/06/2023 1147   MCH 29.3 06/09/2023 1448   MCHC 32.7 07/06/2023 1147   RDW 13.5 07/06/2023 1147   LYMPHSABS 1.4 07/06/2023 1147   MONOABS 0.6 07/06/2023 1147   EOSABS 0.1 07/06/2023 1147   BASOSABS 0.0 07/06/2023 1147    CMP     Component Value Date/Time   NA 140 07/06/2023 1147   K 4.3 07/06/2023 1147   CL 105 07/06/2023 1147   CO2 29 07/06/2023 1147   GLUCOSE 91 07/06/2023 1147   BUN 29 (H) 07/06/2023 1147   CREATININE 0.95 07/06/2023 1147   CREATININE 0.94 06/09/2023 1448   CALCIUM  9.3 07/06/2023 1147   PROT 7.2 07/06/2023 1147   ALBUMIN 4.2 07/06/2023 1147   AST 14 07/06/2023 1147   AST 14 (L) 07/06/2018 0922   ALT 14 07/06/2023 1147   ALT 11 07/06/2018 0922   ALKPHOS 75 07/06/2023 1147   BILITOT 0.4 07/06/2023 1147   BILITOT 0.4 07/06/2018 0922   GFRNONAA >60 07/06/2018 0922   GFRAA >60 07/06/2018 0922     Assessment and Plan:   ROBBYE DEDE is a 64 y.o. y/o female presents for flareup of:  1.  GERD  with esophagitis 2.  Hiatal hernia Assessment & Plan Gastroesophageal reflux disease with esophagitis and hiatal hernia, post-gastric sleeve Moderate to severe GERD with esophagitis and hiatal hernia, refractory to standard-dose PPI (pantoprazole  40 mg every morning), exacerbated by anatomical changes from hiatal hernia and prior gastric sleeve.  She is having breakthrough nocturnal acid reflux.  - Increased pantoprazole  to 40 mg twice daily, 30 minutes before breakfast and dinner; 60 tablets/month with refills. - Advised use of Tums, Mylanta, Maalox, and famotidine as needed for breakthrough symptoms. - Aggressive lifestyle changes.  Provided dietary and lifestyle counseling: avoid coffee, citrus, tomatoes, carbonated beverages, chocolate, peppermint, garlic, onions, fatty, spicy, and fried foods; avoid eating or drinking within two hours of lying down. - Consider alternative PPI or newer agents if symptoms persist.  Consider Nexium 40 mg twice daily, omeprazole 40 mg twice daily, Dexilant 60 mg once daily, or Voquezna. - Consider repeat upper endoscopy if medical therapy fails.  Patient declined EGD today. - Consider surgical intervention (gastric bypass/hiatal hernia repair) only if all medical options fail. - Patient is reluctant to pursue surgical repair of hiatal hernia. - Instructed to report persistent or worsening symptoms.   Megan Console, PA-C  Follow up in 6 weeks with TG to assess response to treatment.  Follow-up sooner symptoms worsen.   "

## 2024-02-19 NOTE — Patient Instructions (Signed)
 We have sent the following medications to your pharmacy for you to pick up at your convenience: Pantoprazole  40 mg twice daily   Please follow up sooner if symptoms increase or worsen  Due to recent changes in healthcare laws, you may see the results of your imaging and laboratory studies on MyChart before your provider has had a chance to review them.  We understand that in some cases there may be results that are confusing or concerning to you. Not all laboratory results come back in the same time frame and the provider may be waiting for multiple results in order to interpret others.  Please give us  48 hours in order for your provider to thoroughly review all the results before contacting the office for clarification of your results.   Thank you for trusting me with your gastrointestinal care!   Ellouise Console, PA-C _______________________________________________________  If your blood pressure at your visit was 140/90 or greater, please contact your primary care physician to follow up on this.  _______________________________________________________  If you are age 63 or older, your body mass index should be between 23-30. Your Body mass index is 38.37 kg/m. If this is out of the aforementioned range listed, please consider follow up with your Primary Care Provider.  If you are age 66 or younger, your body mass index should be between 19-25. Your Body mass index is 38.37 kg/m. If this is out of the aformentioned range listed, please consider follow up with your Primary Care Provider.   ________________________________________________________  The [facility] providers would like to encourage you to use MYCHART to communicate with providers for non-urgent requests or questions.  Due to long hold times on the telephone, sending your provider a message by [facility] may be a faster and more efficient way to get a response.  Please allow 48 business hours for a response.  Please remember that this  is for non-urgent requests.  _______________________________________________________

## 2024-04-01 ENCOUNTER — Ambulatory Visit: Admitting: Physician Assistant
